# Patient Record
Sex: Female | Born: 1970
Health system: Southern US, Community
[De-identification: ages and names within clinical notes are randomized; demographics above are authoritative.]

## PROBLEM LIST (undated history)

## (undated) DIAGNOSIS — R079 Chest pain, unspecified: Secondary | ICD-10-CM

## (undated) DIAGNOSIS — D649 Anemia, unspecified: Secondary | ICD-10-CM

## (undated) DIAGNOSIS — D219 Benign neoplasm of connective and other soft tissue, unspecified: Secondary | ICD-10-CM

## (undated) HISTORY — PX: THYROID SURGERY: SHX805

## (undated) HISTORY — DX: Chest pain, unspecified: R07.9

---

## 2002-07-17 ENCOUNTER — Other Ambulatory Visit: Admission: RE | Admit: 2002-07-17 | Discharge: 2002-07-17 | Payer: Self-pay | Admitting: Family Medicine

## 2003-05-12 DIAGNOSIS — D219 Benign neoplasm of connective and other soft tissue, unspecified: Secondary | ICD-10-CM

## 2003-05-12 HISTORY — DX: Benign neoplasm of connective and other soft tissue, unspecified: D21.9

## 2004-01-11 ENCOUNTER — Other Ambulatory Visit: Admission: RE | Admit: 2004-01-11 | Discharge: 2004-01-11 | Payer: Self-pay | Admitting: Family Medicine

## 2004-01-16 ENCOUNTER — Encounter: Admission: RE | Admit: 2004-01-16 | Discharge: 2004-01-16 | Payer: Self-pay | Admitting: Family Medicine

## 2004-02-22 ENCOUNTER — Other Ambulatory Visit: Admission: RE | Admit: 2004-02-22 | Discharge: 2004-02-22 | Payer: Self-pay | Admitting: Family Medicine

## 2007-10-05 ENCOUNTER — Ambulatory Visit: Payer: Self-pay | Admitting: Internal Medicine

## 2007-10-05 DIAGNOSIS — D259 Leiomyoma of uterus, unspecified: Secondary | ICD-10-CM | POA: Insufficient documentation

## 2007-10-05 DIAGNOSIS — G43909 Migraine, unspecified, not intractable, without status migrainosus: Secondary | ICD-10-CM | POA: Insufficient documentation

## 2007-10-05 DIAGNOSIS — F17200 Nicotine dependence, unspecified, uncomplicated: Secondary | ICD-10-CM | POA: Insufficient documentation

## 2007-11-25 ENCOUNTER — Encounter: Payer: Self-pay | Admitting: Internal Medicine

## 2011-09-07 DIAGNOSIS — D649 Anemia, unspecified: Secondary | ICD-10-CM | POA: Insufficient documentation

## 2012-10-05 ENCOUNTER — Other Ambulatory Visit: Payer: Self-pay | Admitting: Obstetrics and Gynecology

## 2012-10-05 DIAGNOSIS — D219 Benign neoplasm of connective and other soft tissue, unspecified: Secondary | ICD-10-CM

## 2012-10-11 ENCOUNTER — Ambulatory Visit
Admission: RE | Admit: 2012-10-11 | Discharge: 2012-10-11 | Disposition: A | Discharge status: Home / Self Care | Payer: BC Managed Care – PPO | Source: Ambulatory Visit | Attending: Obstetrics and Gynecology | Admitting: Obstetrics and Gynecology

## 2012-10-11 DIAGNOSIS — D219 Benign neoplasm of connective and other soft tissue, unspecified: Secondary | ICD-10-CM

## 2012-10-11 MED ORDER — GADOBENATE DIMEGLUMINE 529 MG/ML IV SOLN
20.0000 mL | Freq: Once | INTRAVENOUS | Status: AC | PRN
  Administered 2012-10-11: 20 mL via INTRAVENOUS

## 2012-11-24 ENCOUNTER — Other Ambulatory Visit: Payer: Self-pay | Admitting: Obstetrics and Gynecology

## 2012-11-24 DIAGNOSIS — N920 Excessive and frequent menstruation with regular cycle: Secondary | ICD-10-CM

## 2012-11-24 DIAGNOSIS — D259 Leiomyoma of uterus, unspecified: Secondary | ICD-10-CM

## 2012-12-06 ENCOUNTER — Ambulatory Visit
Admission: RE | Admit: 2012-12-06 | Discharge: 2012-12-06 | Disposition: A | Discharge status: Home / Self Care | Payer: BC Managed Care – PPO | Source: Ambulatory Visit | Attending: Obstetrics and Gynecology | Admitting: Obstetrics and Gynecology

## 2012-12-06 DIAGNOSIS — N920 Excessive and frequent menstruation with regular cycle: Secondary | ICD-10-CM

## 2012-12-06 DIAGNOSIS — D259 Leiomyoma of uterus, unspecified: Secondary | ICD-10-CM

## 2012-12-06 HISTORY — DX: Benign neoplasm of connective and other soft tissue, unspecified: D21.9

## 2012-12-19 ENCOUNTER — Encounter (HOSPITAL_COMMUNITY): Payer: Self-pay | Admitting: Pharmacy Technician

## 2012-12-20 ENCOUNTER — Other Ambulatory Visit: Payer: Self-pay | Admitting: Radiology

## 2012-12-23 ENCOUNTER — Encounter (HOSPITAL_COMMUNITY): Payer: Self-pay

## 2012-12-23 ENCOUNTER — Ambulatory Visit (HOSPITAL_COMMUNITY)
Admission: RE | Admit: 2012-12-23 | Discharge: 2012-12-23 | Disposition: A | Discharge status: Home / Self Care | Payer: BC Managed Care – PPO | Source: Ambulatory Visit | Attending: Interventional Radiology | Admitting: Interventional Radiology

## 2012-12-23 ENCOUNTER — Observation Stay (HOSPITAL_COMMUNITY)
Admission: RE | Admit: 2012-12-23 | Discharge: 2012-12-24 | Disposition: A | Discharge status: Home / Self Care | Payer: BC Managed Care – PPO | Source: Ambulatory Visit | Attending: Interventional Radiology | Admitting: Interventional Radiology

## 2012-12-23 ENCOUNTER — Other Ambulatory Visit: Payer: Self-pay | Admitting: Interventional Radiology

## 2012-12-23 VITALS — BP 130/61 | HR 56 | Temp 98.5°F | Resp 18 | Ht 62.0 in | Wt 210.0 lb

## 2012-12-23 DIAGNOSIS — F17200 Nicotine dependence, unspecified, uncomplicated: Secondary | ICD-10-CM | POA: Insufficient documentation

## 2012-12-23 DIAGNOSIS — D259 Leiomyoma of uterus, unspecified: Secondary | ICD-10-CM

## 2012-12-23 DIAGNOSIS — Z79899 Other long term (current) drug therapy: Secondary | ICD-10-CM | POA: Insufficient documentation

## 2012-12-23 DIAGNOSIS — G43909 Migraine, unspecified, not intractable, without status migrainosus: Secondary | ICD-10-CM

## 2012-12-23 DIAGNOSIS — N920 Excessive and frequent menstruation with regular cycle: Secondary | ICD-10-CM

## 2012-12-23 DIAGNOSIS — D251 Intramural leiomyoma of uterus: Principal | ICD-10-CM | POA: Insufficient documentation

## 2012-12-23 HISTORY — DX: Anemia, unspecified: D64.9

## 2012-12-23 LAB — BASIC METABOLIC PANEL
BUN: 9 mg/dL (ref 6–23)
Calcium: 9.5 mg/dL (ref 8.4–10.5)
Chloride: 104 mEq/L (ref 96–112)
GFR calc Af Amer: >90 mL/min (ref >90)
Glucose, Bld: 93 mg/dL (ref 70–99)
Sodium: 137 mEq/L (ref 135–145)

## 2012-12-23 LAB — PROTIME-INR: INR: 0.93 (ref 0.00–1.49)

## 2012-12-23 LAB — CBC
HCT: 37.6 % (ref 36.0–46.0)
MCH: 28.8 pg (ref 26.0–34.0)
MCV: 87.4 fL (ref 78.0–100.0)
WBC: 7.7 10*3/uL (ref 4.0–10.5)

## 2012-12-23 LAB — APTT: aPTT: 30 seconds (ref 24–37)

## 2012-12-23 LAB — HCG, SERUM, QUALITATIVE: Preg, Serum: NEGATIVE

## 2012-12-23 MED ORDER — PROMETHAZINE HCL 25 MG PO TABS: 25.0000 mg | ORAL_TABLET | Freq: Three times a day (TID) | ORAL | Status: DC | PRN

## 2012-12-23 MED ORDER — HYDROMORPHONE HCL PF 2 MG/ML IJ SOLN
INTRAMUSCULAR | Status: AC
  Filled 2012-12-23: qty 1

## 2012-12-23 MED ORDER — MIDAZOLAM HCL 2 MG/2ML IJ SOLN
INTRAMUSCULAR | Status: AC
  Filled 2012-12-23: qty 6

## 2012-12-23 MED ORDER — CEFAZOLIN SODIUM-DEXTROSE 2-3 GM-% IV SOLR
2.0000 g | INTRAVENOUS | Status: AC
  Administered 2012-12-23: 2 g via INTRAVENOUS

## 2012-12-23 MED ORDER — DOCUSATE SODIUM 100 MG PO CAPS
100.0000 mg | ORAL_CAPSULE | Freq: Two times a day (BID) | ORAL | Status: DC
  Administered 2012-12-23 – 2012-12-24 (×2): 100 mg via ORAL
  Filled 2012-12-23 (×3): qty 1

## 2012-12-23 MED ORDER — SODIUM CHLORIDE 0.9 % IJ SOLN: 3.0000 mL | Freq: Two times a day (BID) | INTRAMUSCULAR | Status: DC

## 2012-12-23 MED ORDER — SODIUM CHLORIDE 0.9 % IJ SOLN: 9.0000 mL | INTRAMUSCULAR | Status: DC | PRN

## 2012-12-23 MED ORDER — FENTANYL CITRATE 0.05 MG/ML IJ SOLN
INTRAMUSCULAR | Status: AC
  Filled 2012-12-23: qty 6

## 2012-12-23 MED ORDER — MIDAZOLAM HCL 2 MG/2ML IJ SOLN
INTRAMUSCULAR | Status: AC | PRN
  Administered 2012-12-23: 1 mg via INTRAVENOUS
  Administered 2012-12-23 (×2): 0.5 mg via INTRAVENOUS
  Administered 2012-12-23: 1 mg via INTRAVENOUS
  Administered 2012-12-23 (×2): 0.5 mg via INTRAVENOUS

## 2012-12-23 MED ORDER — CEFAZOLIN SODIUM-DEXTROSE 2-3 GM-% IV SOLR
INTRAVENOUS | Status: AC
  Filled 2012-12-23: qty 50

## 2012-12-23 MED ORDER — FENTANYL CITRATE 0.05 MG/ML IJ SOLN
INTRAMUSCULAR | Status: AC | PRN
  Administered 2012-12-23: 25 ug via INTRAVENOUS
  Administered 2012-12-23: 100 ug via INTRAVENOUS
  Administered 2012-12-23 (×3): 25 ug via INTRAVENOUS

## 2012-12-23 MED ORDER — ONDANSETRON HCL 4 MG/2ML IJ SOLN
4.0000 mg | Freq: Four times a day (QID) | INTRAMUSCULAR | Status: DC | PRN
  Administered 2012-12-23 – 2012-12-24 (×2): 4 mg via INTRAVENOUS

## 2012-12-23 MED ORDER — LIDOCAINE HCL 1 % IJ SOLN
INTRAMUSCULAR | Status: AC
  Filled 2012-12-23: qty 20

## 2012-12-23 MED ORDER — KETOROLAC TROMETHAMINE 30 MG/ML IJ SOLN
30.0000 mg | INTRAMUSCULAR | Status: AC
  Administered 2012-12-23: 30 mg via INTRAVENOUS
  Filled 2012-12-23: qty 1

## 2012-12-23 MED ORDER — HYDROMORPHONE 0.3 MG/ML IV SOLN
INTRAVENOUS | Status: DC
Start: 2012-12-23 — End: 2012-12-24
  Administered 2012-12-23: 15:00:00 via INTRAVENOUS
  Administered 2012-12-23: 1.5 mg via INTRAVENOUS
  Administered 2012-12-24: 0.3 mg via INTRAVENOUS
  Filled 2012-12-23 (×2): qty 25

## 2012-12-23 MED ORDER — ONDANSETRON HCL 4 MG/2ML IJ SOLN
4.0000 mg | Freq: Four times a day (QID) | INTRAMUSCULAR | Status: DC | PRN
  Filled 2012-12-23 (×2): qty 2

## 2012-12-23 MED ORDER — IOHEXOL 300 MG/ML  SOLN
80.0000 mL | Freq: Once | INTRAMUSCULAR | Status: AC | PRN
  Administered 2012-12-23: 60 mL via INTRA_ARTERIAL

## 2012-12-23 MED ORDER — IBUPROFEN 800 MG PO TABS
800.0000 mg | ORAL_TABLET | Freq: Four times a day (QID) | ORAL | Status: DC
  Administered 2012-12-23 – 2012-12-24 (×3): 800 mg via ORAL
  Filled 2012-12-23 (×6): qty 1

## 2012-12-23 MED ORDER — DIPHENHYDRAMINE HCL 12.5 MG/5ML PO ELIX
12.5000 mg | ORAL_SOLUTION | Freq: Four times a day (QID) | ORAL | Status: DC | PRN
  Filled 2012-12-23: qty 5

## 2012-12-23 MED ORDER — SODIUM CHLORIDE 0.9 % IV SOLN
INTRAVENOUS | Status: DC
  Administered 2012-12-23 (×2): via INTRAVENOUS

## 2012-12-23 MED ORDER — NALOXONE HCL 0.4 MG/ML IJ SOLN: 0.4000 mg | INTRAMUSCULAR | Status: DC | PRN

## 2012-12-23 MED ORDER — SODIUM CHLORIDE 0.9 % IV SOLN: 250.0000 mL | INTRAVENOUS | Status: DC | PRN

## 2012-12-23 MED ORDER — DIPHENHYDRAMINE HCL 50 MG/ML IJ SOLN: 12.5000 mg | Freq: Four times a day (QID) | INTRAMUSCULAR | Status: DC | PRN

## 2012-12-23 MED ORDER — SODIUM CHLORIDE 0.9 % IJ SOLN: 3.0000 mL | INTRAMUSCULAR | Status: DC | PRN

## 2012-12-23 MED ORDER — HYDROCODONE-ACETAMINOPHEN 5-325 MG PO TABS: 1.0000 | ORAL_TABLET | ORAL | Status: DC | PRN

## 2012-12-23 MED ORDER — HYDROMORPHONE HCL PF 1 MG/ML IJ SOLN
INTRAMUSCULAR | Status: AC | PRN
  Administered 2012-12-23: 1 mg via INTRAVENOUS

## 2012-12-23 MED ORDER — PROMETHAZINE HCL 25 MG RE SUPP: 25.0000 mg | Freq: Three times a day (TID) | RECTAL | Status: DC | PRN

## 2012-12-23 NOTE — H&P (Signed)
Diana Davies is an 42 y.o. female.   Chief Complaint: menorrhagia, uterine fibroids HPI: Patient with history of symptomatic uterine fibroids presents today for bilateral uterine artery embolization.  Past Medical History  Diagnosis Date  . Fibroids 2005  anemia, migraines, reactive depression ; denies HTN, CAD, DM, cancer, lung disease  PSH: left axillary cyst removal Social History:  reports that she has been smoking Cigarettes.  She started smoking about 30 years ago. She has been smoking about 0.50 packs per day. She does not have any smokeless tobacco history on file. She reports that  drinks alcohol. She reports that she does not use illicit drugs. FH: positive for colon cancer, heart disease, HTN Allergies: No Known Allergies  Current outpatient prescriptions:ascorbic acid (VITAMIN C) 1000 MG tablet, Take 1,000 mg by mouth daily., Disp: , Rfl: ;  ferrous fumarate (HEMOCYTE - 106 MG FE) 325 (106 FE) MG TABS tablet, Take 1 tablet by mouth daily., Disp: , Rfl: ;  Multiple Vitamin (MULTIVITAMIN WITH MINERALS) TABS tablet, Take 1 tablet by mouth daily., Disp: , Rfl: ;  naproxen sodium (ANAPROX) 220 MG tablet, Take 220 mg by mouth 2 (two) times daily with a meal., Disp: , Rfl:  Current facility-administered medications:0.9 %  sodium chloride infusion, , Intravenous, Continuous, D Jeananne Rama, PA-C, Last Rate: 75 mL/hr at 12/23/12 1209;  ceFAZolin (ANCEF) IVPB 2 g/50 mL premix, 2 g, Intravenous, On Call, D Kevin Allred, PA-C;  diphenhydrAMINE (BENADRYL) 12.5 MG/5ML elixir 12.5 mg, 12.5 mg, Oral, Q6H PRN, Dayne Oley Balm III, MD diphenhydrAMINE (BENADRYL) injection 12.5 mg, 12.5 mg, Intravenous, Q6H PRN, Durwin Glaze III, MD;  HYDROmorphone (DILAUDID) PCA injection 0.3 mg/mL, , Intravenous, Q4H, Dayne Oley Balm III, MD;  ketorolac (TORADOL) 30 MG/ML injection 30 mg, 30 mg, Intravenous, On Call, D Jeananne Rama, PA-C;  naloxone (NARCAN) injection 0.4 mg, 0.4 mg, Intravenous, PRN,  Durwin Glaze III, MD ondansetron [facility]) injection 4 mg, 4 mg, Intravenous, Q6H PRN, Durwin Glaze III, MD;  sodium chloride 0.9 % injection 9 mL, 9 mL, Intravenous, PRN, Durwin Glaze III, MD   Results for orders placed during the hospital encounter of 12/23/12 (from the past 48 hour(s))  APTT     Status: None   Collection Time    12/23/12 12:00 PM      Result Value Range   aPTT 30  24 - 37 seconds  CBC     Status: Abnormal   Collection Time    12/23/12 12:00 PM      Result Value Range   WBC 7.7  4.0 - 10.5 K/uL   RBC 4.30  3.87 - 5.11 MIL/uL   Hemoglobin 12.4  12.0 - 15.0 g/dL   HCT 81.1  91.4 - 78.2 %   MCV 87.4  78.0 - 100.0 fL   MCH 28.8  26.0 - 34.0 pg   MCHC 33.0  30.0 - 36.0 g/dL   RDW 95.6  21.3 - 08.6 %   Platelets 474 (*) 150 - 400 K/uL  PROTIME-INR     Status: None   Collection Time    12/23/12 12:00 PM      Result Value Range   Prothrombin Time 12.3  11.6 - 15.2 seconds   INR 0.93  0.00 - 1.49   No results found.  Review of Systems  Constitutional: Negative for fever and chills.  Respiratory: Negative for cough and shortness of breath.   Cardiovascular: Negative for chest pain.  Gastrointestinal: Positive for constipation.  Negative for nausea, vomiting and blood in stool.       Intermittent pelvic pain/cramping  Genitourinary: Negative for dysuria.  Musculoskeletal: Negative for back pain.  Neurological: Negative for headaches.  Endo/Heme/Allergies:       Menorrhagia   Vitals: BP 137/86  HR 101  R 18  TEMP 98  O2 SATS 98% RA Last menstrual period 11/15/2012. Physical Exam  Constitutional: She is oriented to person, place, and time. She appears well-developed and well-nourished.  Cardiovascular: Normal rate and regular rhythm.   Respiratory: Effort normal and breath sounds normal.  GI: Soft. Bowel sounds are normal. There is no tenderness.  obese  Musculoskeletal: Normal range of motion. She exhibits no edema.  Neurological: She  is alert and oriented to person, place, and time.     Assessment/Plan Pt with hx of symptomatic uterine fibroids. Plan is for bilateral uterine artery embolization today followed by overnight observation for pain control/hemodynamic monitoring. Details/risks of procedure d/w pt/sister with their understanding and consent.  ALLRED,D KEVIN 12/23/2012, 12:28 PM

## 2012-12-23 NOTE — Procedures (Signed)
Uterine fibroid embolization via R CFA No complication No blood loss. See complete dictation in [facility].

## 2012-12-23 NOTE — ED Notes (Signed)
5Fr sheath removed from RFA by Dr. Deanne Coffer.  Hemostasis achieved using Exoseal device.  R groin level 0, 2+RDP.  Gauze tegaderm drsg applied.

## 2012-12-23 NOTE — Progress Notes (Signed)
Embolization site CD and I, pedal pulse +2.  

## 2012-12-23 NOTE — Progress Notes (Signed)
EMBOLIZATION SITE CD AND I, PEDAL PULSE +2. 

## 2012-12-23 NOTE — Progress Notes (Signed)
Foley catheter removed per MD order. Patient tolerated procedure well. 

## 2012-12-23 NOTE — Progress Notes (Signed)
Subjective: Pt c/o pelvic pressure; denies N/V  Objective: Vital signs in last 24 hours: Temp:  [97.5 F (36.4 C)-98 F (36.7 C)] 97.5 F (36.4 C) (08/15 1546) Pulse Rate:  [76-101] 76 (08/15 1546) Resp:  [10-18] 11 (08/15 1546) BP: (112-166)/(79-96) 166/83 mmHg (08/15 1546) SpO2:  [96 %-100 %] 100 % (08/15 1546) Weight:  [210 lb (95.255 kg)] 210 lb (95.255 kg) (08/15 1140) Last BM Date: 12/23/12  Intake/Output from previous day:   Intake/Output this shift:  Puncture site rt CFA clean and dry, mildly tender, no hematoma; intact distal pulses; abd- obese,+BS, mildly tender mid pelvic region  Lab Results:   Recent Labs  12/23/12 1200  WBC 7.7  HGB 12.4  HCT 37.6  PLT 474*   BMET  Recent Labs  12/23/12 1200  NA 137  K 3.8  CL 104  CO2 22  GLUCOSE 93  BUN 9  CREATININE 0.59  CALCIUM 9.5   PT/INR  Recent Labs  12/23/12 1200  LABPROT 12.3  INR 0.93   ABG No results found for this basename: PHART, PCO2, PO2, HCO3,  in the last 72 hours  Studies/Results: Ir Angiogram Pelvis Selective Or Supraselective  12/23/2012   *RADIOLOGY REPORT*  Clinical data:  Symptomatic uterine fibroids.  See previous consultation.  BILATERAL UTERINE ARTERY EMBOLIZATION:  Technique: The procedure, risks, benefits, and alternatives were explained to the patient.  Questions regarding the procedure were encouraged and answered.  The patient understands and consents to the procedure.  As antibiotic prophylaxis, cefazolin 2 grams IV was ordered pre- procedure and administered intravenously within one hour of incision.  An appropriate skin entry site was determined under fluoroscopy. Skin site was marked, prepped with Betadine, and draped in usual sterile fashion, and infiltrated locally with 1% lidocaine.  Intravenous Fentanyl and Versed were administered as conscious sedation during continuous cardiorespiratory monitoring by the radiology RN, with a total moderate sedation time of 45  minutes.  Under real-time ultrasound guidance, the right common femoral artery was accessed with a micropuncture 21-gauge needle using singlewall technique in a single pass. Ultrasound imaging documentation was saved. Needle was exchanged over a 018 guide wire for a transitional dilator, which allowed passage of a Benson wire, over which a 5-French vascular sheath was placed, through which a 5 French C2 catheter was used to selectively catheterize the left internal iliac artery for pelvic arteriography. A coaxial Progreat catheter was advanced with the  Renegade wire and used to selectively catheterize the left uterine artery. The microcatheter tip was positioned in the   horizontal segment. Selective arteriogram confirms appropriate positioning. Distal branches of the left uterine artery were embolized with 500-700 micron Embospheres.  Embolization continued until near stasis of flow was achieved. Microcatheter was withdrawn and a followup selective left internal iliac arteriogram was obtained. A Waltman loop was then formed with the C2 catheter, and the right internal iliac artery was selectively catheterized. Again the Progreat catheter with a Renegade guidewire was coaxially advanced and used to selectively catheterize the right uterine artery horizontal segment. Confirmatory arteriogram was performed.  Right uterine artery branches were embolized with 500-700 micron Embospheres to near stasis of flow. A total of 7 vials of Embospheres were utilized for the case. Microcatheter was withdrawn and a followup arteriogram of the right internal iliac artery was performed. C2 catheter was removed.  After confirmatory femoral arteriography, hemostasis was achieved with the aid of the Exoseal device. No immediate complication. Patient tolerated the procedure well.  Fluoroscopy time: 90  minutes 12seconds  IMPRESSION:  1. Technically successful bilateral uterine artery embolization using 500-726micron Embospheres.    Original Report Authenticated By: D. Andria Rhein, MD   Ir Angiogram Pelvis Selective Or Supraselective  12/23/2012   *RADIOLOGY REPORT*  Clinical data:  Symptomatic uterine fibroids.  See previous consultation.  BILATERAL UTERINE ARTERY EMBOLIZATION:  Technique: The procedure, risks, benefits, and alternatives were explained to the patient.  Questions regarding the procedure were encouraged and answered.  The patient understands and consents to the procedure.  As antibiotic prophylaxis, cefazolin 2 grams IV was ordered pre- procedure and administered intravenously within one hour of incision.  An appropriate skin entry site was determined under fluoroscopy. Skin site was marked, prepped with Betadine, and draped in usual sterile fashion, and infiltrated locally with 1% lidocaine.  Intravenous Fentanyl and Versed were administered as conscious sedation during continuous cardiorespiratory monitoring by the radiology RN, with a total moderate sedation time of 45 minutes.  Under real-time ultrasound guidance, the right common femoral artery was accessed with a micropuncture 21-gauge needle using singlewall technique in a single pass. Ultrasound imaging documentation was saved. Needle was exchanged over a 018 guide wire for a transitional dilator, which allowed passage of a Benson wire, over which a 5-French vascular sheath was placed, through which a 5 French C2 catheter was used to selectively catheterize the left internal iliac artery for pelvic arteriography. A coaxial Progreat catheter was advanced with the  Renegade wire and used to selectively catheterize the left uterine artery. The microcatheter tip was positioned in the   horizontal segment. Selective arteriogram confirms appropriate positioning. Distal branches of the left uterine artery were embolized with 500-700 micron Embospheres.  Embolization continued until near stasis of flow was achieved. Microcatheter was withdrawn and a followup selective left  internal iliac arteriogram was obtained. A Waltman loop was then formed with the C2 catheter, and the right internal iliac artery was selectively catheterized. Again the Progreat catheter with a Renegade guidewire was coaxially advanced and used to selectively catheterize the right uterine artery horizontal segment. Confirmatory arteriogram was performed.  Right uterine artery branches were embolized with 500-700 micron Embospheres to near stasis of flow. A total of 7 vials of Embospheres were utilized for the case. Microcatheter was withdrawn and a followup arteriogram of the right internal iliac artery was performed. C2 catheter was removed.  After confirmatory femoral arteriography, hemostasis was achieved with the aid of the Exoseal device. No immediate complication. Patient tolerated the procedure well.  Fluoroscopy time: 16 minutes 12seconds  IMPRESSION:  1. Technically successful bilateral uterine artery embolization using 500-761micron Embospheres.   Original Report Authenticated By: D. Andria Rhein, MD   Ir Angiogram Selective Each Additional Vessel  12/23/2012   *RADIOLOGY REPORT*  Clinical data:  Symptomatic uterine fibroids.  See previous consultation.  BILATERAL UTERINE ARTERY EMBOLIZATION:  Technique: The procedure, risks, benefits, and alternatives were explained to the patient.  Questions regarding the procedure were encouraged and answered.  The patient understands and consents to the procedure.  As antibiotic prophylaxis, cefazolin 2 grams IV was ordered pre- procedure and administered intravenously within one hour of incision.  An appropriate skin entry site was determined under fluoroscopy. Skin site was marked, prepped with Betadine, and draped in usual sterile fashion, and infiltrated locally with 1% lidocaine.  Intravenous Fentanyl and Versed were administered as conscious sedation during continuous cardiorespiratory monitoring by the radiology RN, with a total moderate sedation time of 45  minutes.  Under real-time ultrasound guidance, the  right common femoral artery was accessed with a micropuncture 21-gauge needle using singlewall technique in a single pass. Ultrasound imaging documentation was saved. Needle was exchanged over a 018 guide wire for a transitional dilator, which allowed passage of a Benson wire, over which a 5-French vascular sheath was placed, through which a 5 French C2 catheter was used to selectively catheterize the left internal iliac artery for pelvic arteriography. A coaxial Progreat catheter was advanced with the  Renegade wire and used to selectively catheterize the left uterine artery. The microcatheter tip was positioned in the   horizontal segment. Selective arteriogram confirms appropriate positioning. Distal branches of the left uterine artery were embolized with 500-700 micron Embospheres.  Embolization continued until near stasis of flow was achieved. Microcatheter was withdrawn and a followup selective left internal iliac arteriogram was obtained. A Waltman loop was then formed with the C2 catheter, and the right internal iliac artery was selectively catheterized. Again the Progreat catheter with a Renegade guidewire was coaxially advanced and used to selectively catheterize the right uterine artery horizontal segment. Confirmatory arteriogram was performed.  Right uterine artery branches were embolized with 500-700 micron Embospheres to near stasis of flow. A total of 7 vials of Embospheres were utilized for the case. Microcatheter was withdrawn and a followup arteriogram of the right internal iliac artery was performed. C2 catheter was removed.  After confirmatory femoral arteriography, hemostasis was achieved with the aid of the Exoseal device. No immediate complication. Patient tolerated the procedure well.  Fluoroscopy time: 16 minutes 12seconds  IMPRESSION:  1. Technically successful bilateral uterine artery embolization using 500-77micron Embospheres.    Original Report Authenticated By: D. Andria Rhein, MD   Ir Angiogram Selective Each Additional Vessel  12/23/2012   *RADIOLOGY REPORT*  Clinical data:  Symptomatic uterine fibroids.  See previous consultation.  BILATERAL UTERINE ARTERY EMBOLIZATION:  Technique: The procedure, risks, benefits, and alternatives were explained to the patient.  Questions regarding the procedure were encouraged and answered.  The patient understands and consents to the procedure.  As antibiotic prophylaxis, cefazolin 2 grams IV was ordered pre- procedure and administered intravenously within one hour of incision.  An appropriate skin entry site was determined under fluoroscopy. Skin site was marked, prepped with Betadine, and draped in usual sterile fashion, and infiltrated locally with 1% lidocaine.  Intravenous Fentanyl and Versed were administered as conscious sedation during continuous cardiorespiratory monitoring by the radiology RN, with a total moderate sedation time of 45 minutes.  Under real-time ultrasound guidance, the right common femoral artery was accessed with a micropuncture 21-gauge needle using singlewall technique in a single pass. Ultrasound imaging documentation was saved. Needle was exchanged over a 018 guide wire for a transitional dilator, which allowed passage of a Benson wire, over which a 5-French vascular sheath was placed, through which a 5 French C2 catheter was used to selectively catheterize the left internal iliac artery for pelvic arteriography. A coaxial Progreat catheter was advanced with the  Renegade wire and used to selectively catheterize the left uterine artery. The microcatheter tip was positioned in the   horizontal segment. Selective arteriogram confirms appropriate positioning. Distal branches of the left uterine artery were embolized with 500-700 micron Embospheres.  Embolization continued until near stasis of flow was achieved. Microcatheter was withdrawn and a followup selective left  internal iliac arteriogram was obtained. A Waltman loop was then formed with the C2 catheter, and the right internal iliac artery was selectively catheterized. Again the Progreat catheter with a Renegade guidewire  was coaxially advanced and used to selectively catheterize the right uterine artery horizontal segment. Confirmatory arteriogram was performed.  Right uterine artery branches were embolized with 500-700 micron Embospheres to near stasis of flow. A total of 7 vials of Embospheres were utilized for the case. Microcatheter was withdrawn and a followup arteriogram of the right internal iliac artery was performed. C2 catheter was removed.  After confirmatory femoral arteriography, hemostasis was achieved with the aid of the Exoseal device. No immediate complication. Patient tolerated the procedure well.  Fluoroscopy time: 16 minutes 12seconds  IMPRESSION:  1. Technically successful bilateral uterine artery embolization using 500-711micron Embospheres.   Original Report Authenticated By: D. Andria Rhein, MD   Ir US Guide Vasc Access Right  12/23/2012   *RADIOLOGY REPORT*  Clinical data:  Symptomatic uterine fibroids.  See previous consultation.  BILATERAL UTERINE ARTERY EMBOLIZATION:  Technique: The procedure, risks, benefits, and alternatives were explained to the patient.  Questions regarding the procedure were encouraged and answered.  The patient understands and consents to the procedure.  As antibiotic prophylaxis, cefazolin 2 grams IV was ordered pre- procedure and administered intravenously within one hour of incision.  An appropriate skin entry site was determined under fluoroscopy. Skin site was marked, prepped with Betadine, and draped in usual sterile fashion, and infiltrated locally with 1% lidocaine.  Intravenous Fentanyl and Versed were administered as conscious sedation during continuous cardiorespiratory monitoring by the radiology RN, with a total moderate sedation time of 45 minutes.  Under  real-time ultrasound guidance, the right common femoral artery was accessed with a micropuncture 21-gauge needle using singlewall technique in a single pass. Ultrasound imaging documentation was saved. Needle was exchanged over a 018 guide wire for a transitional dilator, which allowed passage of a Benson wire, over which a 5-French vascular sheath was placed, through which a 5 French C2 catheter was used to selectively catheterize the left internal iliac artery for pelvic arteriography. A coaxial Progreat catheter was advanced with the  Renegade wire and used to selectively catheterize the left uterine artery. The microcatheter tip was positioned in the   horizontal segment. Selective arteriogram confirms appropriate positioning. Distal branches of the left uterine artery were embolized with 500-700 micron Embospheres.  Embolization continued until near stasis of flow was achieved. Microcatheter was withdrawn and a followup selective left internal iliac arteriogram was obtained. A Waltman loop was then formed with the C2 catheter, and the right internal iliac artery was selectively catheterized. Again the Progreat catheter with a Renegade guidewire was coaxially advanced and used to selectively catheterize the right uterine artery horizontal segment. Confirmatory arteriogram was performed.  Right uterine artery branches were embolized with 500-700 micron Embospheres to near stasis of flow. A total of 7 vials of Embospheres were utilized for the case. Microcatheter was withdrawn and a followup arteriogram of the right internal iliac artery was performed. C2 catheter was removed.  After confirmatory femoral arteriography, hemostasis was achieved with the aid of the Exoseal device. No immediate complication. Patient tolerated the procedure well.  Fluoroscopy time: 16 minutes 12seconds  IMPRESSION:  1. Technically successful bilateral uterine artery embolization using 500-754micron Embospheres.   Original Report  Authenticated By: D. Andria Rhein, MD   Ir Embo Tumor Organ Ischemia Infarct Inc Guide Roadmapping  12/23/2012   *RADIOLOGY REPORT*  Clinical data:  Symptomatic uterine fibroids.  See previous consultation.  BILATERAL UTERINE ARTERY EMBOLIZATION:  Technique: The procedure, risks, benefits, and alternatives were explained to the patient.  Questions regarding the procedure were  encouraged and answered.  The patient understands and consents to the procedure.  As antibiotic prophylaxis, cefazolin 2 grams IV was ordered pre- procedure and administered intravenously within one hour of incision.  An appropriate skin entry site was determined under fluoroscopy. Skin site was marked, prepped with Betadine, and draped in usual sterile fashion, and infiltrated locally with 1% lidocaine.  Intravenous Fentanyl and Versed were administered as conscious sedation during continuous cardiorespiratory monitoring by the radiology RN, with a total moderate sedation time of 45 minutes.  Under real-time ultrasound guidance, the right common femoral artery was accessed with a micropuncture 21-gauge needle using singlewall technique in a single pass. Ultrasound imaging documentation was saved. Needle was exchanged over a 018 guide wire for a transitional dilator, which allowed passage of a Benson wire, over which a 5-French vascular sheath was placed, through which a 5 French C2 catheter was used to selectively catheterize the left internal iliac artery for pelvic arteriography. A coaxial Progreat catheter was advanced with the  Renegade wire and used to selectively catheterize the left uterine artery. The microcatheter tip was positioned in the   horizontal segment. Selective arteriogram confirms appropriate positioning. Distal branches of the left uterine artery were embolized with 500-700 micron Embospheres.  Embolization continued until near stasis of flow was achieved. Microcatheter was withdrawn and a followup selective left  internal iliac arteriogram was obtained. A Waltman loop was then formed with the C2 catheter, and the right internal iliac artery was selectively catheterized. Again the Progreat catheter with a Renegade guidewire was coaxially advanced and used to selectively catheterize the right uterine artery horizontal segment. Confirmatory arteriogram was performed.  Right uterine artery branches were embolized with 500-700 micron Embospheres to near stasis of flow. A total of 7 vials of Embospheres were utilized for the case. Microcatheter was withdrawn and a followup arteriogram of the right internal iliac artery was performed. C2 catheter was removed.  After confirmatory femoral arteriography, hemostasis was achieved with the aid of the Exoseal device. No immediate complication. Patient tolerated the procedure well.  Fluoroscopy time: 16 minutes 12seconds  IMPRESSION:  1. Technically successful bilateral uterine artery embolization using 500-761micron Embospheres.   Original Report Authenticated By: D. Andria Rhein, MD    Anti-infectives: Anti-infectives   Start     Dose/Rate Route Frequency Ordered Stop   12/23/12 1145  ceFAZolin (ANCEF) IVPB 2 g/50 mL premix     2 g 100 mL/hr over 30 Minutes Intravenous On call 12/23/12 1133 12/23/12 1440      Assessment/Plan: s/p bilateral uterine artery embolization 8/15 secondary to symptomatic uterine fibroids. For overnight obs. Dilaudid PCA for pain. F/u with Dr. Deanne Coffer in IR clinic in 2-3 weeks.   LOS: 0 days    ALLRED,D [facility] 12/23/2012

## 2012-12-23 NOTE — Progress Notes (Signed)
Embolization site CD and I, pedal pulse +2.

## 2012-12-23 NOTE — Progress Notes (Signed)
EMBOLIZATION SITE CD AND I, PEDAL PULSE +2.

## 2012-12-24 NOTE — Discharge Summary (Signed)
Physician Discharge Summary  Patient ID: Diana Davies MRN: [id number] DOB/AGE: 05/15/1970 42 y.o.  Admit date: 12/23/2012 Discharge date: 12/24/2012  Admission Diagnoses: Painful Uterine Fibroids- dysmenorrhea and menorrhagia  Discharge Diagnoses: treatment of uterine fibroid symptoms Active Problems:   * No active hospital problems. *   Discharged Condition: improved  Hospital Course: Bilateral uterine fibroid embolization was performed in Interventional Radiology with Dr Oley Balm 12/23/2012. Pt tolerated procedure well. Overnight stay was without event. Slept well, eating well. Did have some nausea this am but now resolved. No vomiting.  Complains of some low abdominal cramping which is expected. Pt has urinated and passing gas. I have seen and examined pt.  UOP is good; afeb; VSS Plan for follow up with Dr Deanne Coffer in 2-4 weeks; pt will hear from clinic for appt.  Consults: None  Significant Diagnostic Studies: Bilateral Uterine Artery Arteriogram  Treatments: B Uterine Artery Embolization  Discharge Exam: Blood pressure 130/61, pulse 56, temperature 98.5 F (36.9 C), temperature source Oral, resp. rate 18, height 5\' 2"  (1.575 m), weight 210 lb (95.255 kg), last menstrual period 12/12/2012, SpO2 100.00%.  PE: VSS; afeb  Heart: RRR Lungs: CTA Abd: soft; +BS; NT Low abd - minimal pain Extr: FROM Rt groin: NT; no bleeding; no hematoma Rt foot 2+ pulses  Results for orders placed during the hospital encounter of 12/23/12  APTT      Result Value Range   aPTT 30  24 - 37 seconds  BASIC METABOLIC PANEL      Result Value Range   Sodium 137  135 - 145 mEq/L   Potassium 3.8  3.5 - 5.1 mEq/L   Chloride 104  96 - 112 mEq/L   CO2 22  19 - 32 mEq/L   Glucose, Bld 93  70 - 99 mg/dL   BUN 9  6 - 23 mg/dL   Creatinine, Ser 8.29  0.50 - 1.10 mg/dL   Calcium 9.5  8.4 - 56.2 mg/dL   GFR calc non Af Amer >90  >90 mL/min   GFR calc Af Amer >90  >90 mL/min  CBC      Result  Value Range   WBC 7.7  4.0 - 10.5 K/uL   RBC 4.30  3.87 - 5.11 MIL/uL   Hemoglobin 12.4  12.0 - 15.0 g/dL   HCT 13.0  86.5 - 78.4 %   MCV 87.4  78.0 - 100.0 fL   MCH 28.8  26.0 - 34.0 pg   MCHC 33.0  30.0 - 36.0 g/dL   RDW 69.6  29.5 - 28.4 %   Platelets 474 (*) 150 - 400 K/uL  HCG, SERUM, QUALITATIVE      Result Value Range   Preg, Serum NEGATIVE  NEGATIVE  PROTIME-INR      Result Value Range   Prothrombin Time 12.3  11.6 - 15.2 seconds   INR 0.93  0.00 - 1.49    Disposition: B Uterine Artery Embolization performed in IR 12/23/12 Pt has done well overnight Plan for dc now To follow up with Dr Deanne Coffer in 2-4 weeks; clinic will call pt. Continue all home meds Rx: Ibu 600 mg #30        Vicodin 5/325 mg #20        Colace 100 mg #10        Phenergan 12.5 mg #10 Pt has good understanding of dc instructions Agreeable to plan   Discharge Orders   Future Orders Complete By Expires   IR Radiologist Eval &  Mgmt  01/24/2013 02/23/2014   Questions:     Is the patient pregnant?:  No   Preferred Imaging Location?:  [facility]   Reason for Exam (SYMPTOM  OR DIAGNOSIS REQUIRED):  [place] 12/23/12; 2-4 week f/u   Call MD for:  persistant nausea and vomiting  As directed    Call MD for:  redness, tenderness, or signs of infection (pain, swelling, redness, odor or green/yellow discharge around incision site)  As directed    Call MD for:  severe uncontrolled pain  As directed    Call MD for:  temperature >100.4  As directed    Diet - low sodium heart healthy  As directed    Discharge instructions  As directed    Comments:     Restful x 3 days; follow up with Dr Deanne Coffer 2-4 weeks; scheduled will call pt with appt date and time   Discharge wound care:  As directed    Comments:     Leave bandage til after shower today; replace bandage with band aid today and daily x 5 days   Driving Restrictions  As directed    Comments:     No driving x 5 days   Increase activity slowly  As  directed    Lifting restrictions  As directed    Comments:     No lifting over 10 lbs x 5 days   Other Restrictions  As directed    Comments:     May return to work in 2 weeks       Medication List         ascorbic acid 1000 MG tablet  Commonly known as:  VITAMIN C  Take 1,000 mg by mouth daily.     ferrous fumarate 325 (106 FE) MG Tabs tablet  Commonly known as:  HEMOCYTE - 106 mg FE  Take 1 tablet by mouth daily.     multivitamin with minerals Tabs tablet  Take 1 tablet by mouth daily.     naproxen sodium 220 MG tablet  Commonly known as:  ANAPROX  Take 220 mg by mouth 2 (two) times daily with a meal.         Signed: Ganesh Deeg A 12/24/2012, 10:39 AM

## 2012-12-24 NOTE — Progress Notes (Signed)
Discharged instructions reviewed with patient and her sister, they both verbalized understanding,prescriptions given to them- Hulda Marin RN

## 2012-12-24 NOTE — Progress Notes (Signed)
Patient tolerated her diet,no n/v, stable.Hulda Marin RN

## 2012-12-24 NOTE — Progress Notes (Signed)
Patient d/c'd, stable.Hulda Marin RN

## 2012-12-28 ENCOUNTER — Telehealth: Payer: Self-pay | Admitting: Radiology

## 2012-12-28 NOTE — Telephone Encounter (Signed)
Patient called with questions regarding medications. Stopped oxycodone 12/27/2012.  Instructed to take entire Rx strength Ibuprofen as directed.  Stool softener as needed.  Patient states that she had a normal BM this am & that she is feeling better today.    Martrice Apt Carmell Austria, RN 12/28/2012 3:06 PM

## 2013-01-11 ENCOUNTER — Inpatient Hospital Stay: Admission: RE | Admit: 2013-01-11 | Payer: BC Managed Care – PPO | Source: Ambulatory Visit

## 2013-01-26 ENCOUNTER — Encounter: Payer: Self-pay | Admitting: Emergency Medicine

## 2013-01-26 ENCOUNTER — Ambulatory Visit
Admission: RE | Admit: 2013-01-26 | Discharge: 2013-01-26 | Disposition: A | Discharge status: Home / Self Care | Payer: BC Managed Care – PPO | Source: Ambulatory Visit | Attending: Radiology | Admitting: Radiology

## 2013-01-26 DIAGNOSIS — F17200 Nicotine dependence, unspecified, uncomplicated: Secondary | ICD-10-CM

## 2013-01-26 DIAGNOSIS — D259 Leiomyoma of uterus, unspecified: Secondary | ICD-10-CM

## 2013-01-26 DIAGNOSIS — G43909 Migraine, unspecified, not intractable, without status migrainosus: Secondary | ICD-10-CM

## 2013-01-26 DIAGNOSIS — N920 Excessive and frequent menstruation with regular cycle: Secondary | ICD-10-CM

## 2013-01-26 NOTE — Progress Notes (Signed)
LMP:  01/21/13.  Length:  5-6 days. Decreased flow.  Moderate cramping.  Occasional spotting.   Returned to work 01/05/2013.  Overall doing well.  Jaysa Kise Carmell Austria, RN 01/26/2013 3:15 PM

## 2013-03-29 ENCOUNTER — Telehealth: Payer: Self-pay | Admitting: Radiology

## 2013-03-29 NOTE — Telephone Encounter (Signed)
Left message to request patient return call for 3 mo follow up [place] status update.  Morenike Cuff Carmell Austria, RN 03/29/2013 12:31 PM

## 2013-03-30 ENCOUNTER — Telehealth: Payer: Self-pay | Admitting: Emergency Medicine

## 2013-03-30 NOTE — Telephone Encounter (Signed)
Pt returned our call for 68mo f/u [place].  She is doing great, each month gets better and better. She is "doing fabulous" !!!  We will call her for her 36mo f/u appt.    She had questions about billing and I referred her to [facility] at [phone]  Jari Sportsman, EMT 03/30/2013 12:22 PM

## 2013-05-30 ENCOUNTER — Other Ambulatory Visit (HOSPITAL_COMMUNITY): Payer: Self-pay | Admitting: Interventional Radiology

## 2013-05-30 DIAGNOSIS — D219 Benign neoplasm of connective and other soft tissue, unspecified: Secondary | ICD-10-CM

## 2013-06-08 ENCOUNTER — Other Ambulatory Visit: Payer: Self-pay | Admitting: Obstetrics and Gynecology

## 2013-06-08 DIAGNOSIS — D219 Benign neoplasm of connective and other soft tissue, unspecified: Secondary | ICD-10-CM

## 2013-07-20 ENCOUNTER — Telehealth: Payer: Self-pay | Admitting: Radiology

## 2013-07-20 NOTE — Telephone Encounter (Signed)
Left message on patient's M# ---need to schedule 8 mo follow up [place].  Lillyen Schow Riki Rusk, RN 07/20/2013 2:05 PM

## 2013-10-17 ENCOUNTER — Telehealth: Payer: Self-pay | Admitting: Radiology

## 2013-10-17 NOTE — Telephone Encounter (Signed)
Left msg on M# requesting patient call for status update & to schedule follow up (8 mos follow up [place])  Reece Levy, RN 10/17/2013 10:39 AM

## 2013-10-24 ENCOUNTER — Encounter: Payer: Self-pay | Admitting: Radiology

## 2015-11-21 ENCOUNTER — Encounter (HOSPITAL_COMMUNITY): Payer: Self-pay | Admitting: *Deleted

## 2015-11-21 ENCOUNTER — Emergency Department (HOSPITAL_COMMUNITY): Payer: BLUE CROSS/BLUE SHIELD

## 2015-11-21 ENCOUNTER — Emergency Department (HOSPITAL_COMMUNITY)
Admission: EM | Admit: 2015-11-21 | Discharge: 2015-11-22 | Disposition: A | Discharge status: Home / Self Care | Payer: BLUE CROSS/BLUE SHIELD | Attending: Emergency Medicine | Admitting: Emergency Medicine

## 2015-11-21 DIAGNOSIS — F17210 Nicotine dependence, cigarettes, uncomplicated: Secondary | ICD-10-CM | POA: Diagnosis not present

## 2015-11-21 DIAGNOSIS — Z79899 Other long term (current) drug therapy: Secondary | ICD-10-CM | POA: Insufficient documentation

## 2015-11-21 DIAGNOSIS — R079 Chest pain, unspecified: Secondary | ICD-10-CM | POA: Insufficient documentation

## 2015-11-21 DIAGNOSIS — R0789 Other chest pain: Secondary | ICD-10-CM | POA: Diagnosis not present

## 2015-11-21 LAB — CBC
HEMATOCRIT: 37.6 % (ref 36.0–46.0)
HEMOGLOBIN: 12 g/dL (ref 12.0–15.0)
MCH: 27.5 pg (ref 26.0–34.0)
MCHC: 31.9 g/dL (ref 30.0–36.0)
MCV: 86 fL (ref 78.0–100.0)
Platelets: 400 10*3/uL (ref 150–400)
RBC: 4.37 MIL/uL (ref 3.87–5.11)
RDW: 15.7 % — AB (ref 11.5–15.5)
WBC: 6.9 10*3/uL (ref 4.0–10.5)

## 2015-11-21 LAB — BASIC METABOLIC PANEL
Anion gap: 6 (ref 5–15)
BUN: 7 mg/dL (ref 6–20)
CO2: 22 mmol/L (ref 22–32)
Calcium: 8.9 mg/dL (ref 8.9–10.3)
Chloride: 108 mmol/L (ref 101–111)
Creatinine, Ser: 0.66 mg/dL (ref 0.44–1.00)
GFR calc Af Amer: >60 mL/min (ref >60)
GFR calc non Af Amer: >60 mL/min (ref >60)
GLUCOSE: 117 mg/dL — AB (ref 65–99)
Potassium: 3.6 mmol/L (ref 3.5–5.1)
Sodium: 136 mmol/L (ref 135–145)

## 2015-11-21 LAB — I-STAT TROPONIN, ED: Troponin i, poc: 0 ng/mL (ref 0.00–0.08)

## 2015-11-21 NOTE — ED Provider Notes (Signed)
CSN: [id number]     Arrival date & time 11/21/15  2024 History  By signing my name below, I, Evelene Croon, attest that this documentation has been prepared under the direction and in the presence of Orpah Greek, MD . Electronically Signed: Evelene Croon, Scribe. 11/21/2015. 11:42 PM.    Chief Complaint  Patient presents with  . Chest Pain   The history is provided by the patient. No language interpreter was used.   HPI Comments:  Diana Davies is a 45 y.o. female who presents to the Emergency Department complaining of central CP that radiates into her left arm and back. She notes her symptom has been intermittent x a few days. Pt states she may have had similar pain in the past but has never been evaluated for it. She denies exertional trigger. No alleviating factors noted. She also notes FHx of MI; states her mother died of an MI at age 4. Pt is a smoker. She denies h/o DM, HTN, and HLD.   Past Medical History  Diagnosis Date  . Fibroids 2005  . Anemia    History reviewed. No pertinent past surgical history. No family history on file. Social History  Substance Use Topics  . Smoking status: Current Every Day Smoker -- 0.50 packs/day    Types: Cigarettes    Start date: 12/07/1982  . Smokeless tobacco: None  . Alcohol Use: Yes     Comment: 4 x's/wk   OB History    No data available     Review of Systems  Cardiovascular: Positive for chest pain.  Musculoskeletal: Positive for myalgias (LUE) and back pain.  All other systems reviewed and are negative.   Allergies  Review of patient's allergies indicates no known allergies.  Home Medications   Prior to Admission medications   Medication Sig Start Date End Date Taking? Authorizing Provider  ascorbic acid (VITAMIN C) 1000 MG tablet Take 1,000 mg by mouth daily.   Yes Historical Provider, MD  Multiple Vitamin (MULTIVITAMIN WITH MINERALS) TABS tablet Take 1 tablet by mouth daily.   Yes Historical Provider, MD   BP  153/94 mmHg  Pulse 98  Temp(Src) 97.6 F (36.4 C) (Oral)  Resp 18  SpO2 100%  LMP 10/31/2015 Physical Exam  Constitutional: She is oriented to person, place, and time. She appears well-developed and well-nourished. No distress.  HENT:  Head: Normocephalic and atraumatic.  Right Ear: Hearing normal.  Left Ear: Hearing normal.  Nose: Nose normal.  Mouth/Throat: Oropharynx is clear and moist and mucous membranes are normal.  Eyes: Conjunctivae and EOM are normal. Pupils are equal, round, and reactive to light.  Neck: Normal range of motion. Neck supple.  Cardiovascular: Normal rate, regular rhythm, S1 normal and S2 normal.  Exam reveals no gallop and no friction rub.   No murmur heard. Pulmonary/Chest: Effort normal and breath sounds normal. No respiratory distress. She exhibits no tenderness.  Abdominal: Soft. Normal appearance and bowel sounds are normal. There is no hepatosplenomegaly. There is no tenderness. There is no rebound, no guarding, no tenderness at McBurney's point and negative Murphy's sign. No hernia.  Musculoskeletal: Normal range of motion.  Neurological: She is alert and oriented to person, place, and time. She has normal strength. No cranial nerve deficit or sensory deficit. Coordination normal. GCS eye subscore is 4. GCS verbal subscore is 5. GCS motor subscore is 6.  Skin: Skin is warm, dry and intact. No rash noted. No cyanosis.  Psychiatric: She has a normal mood and  affect. Her speech is normal and behavior is normal. Thought content normal.  Nursing note and vitals reviewed.   ED Course  Procedures   DIAGNOSTIC STUDIES:  Oxygen Saturation is 100% on RA, normal by my interpretation.    COORDINATION OF CARE:  11:36 PM Pt updated with results. Discussed treatment plan with pt at bedside and pt agreed to plan.  Labs Review Labs Reviewed  BASIC METABOLIC PANEL - Abnormal; Notable for the following:    Glucose, Bld 117 (*)    All other components within  normal limits  CBC - Abnormal; Notable for the following:    RDW 15.7 (*)    All other components within normal limits  I-STAT TROPOININ, ED  Randolm Idol, ED    Imaging Review Dg Chest 2 View  11/21/2015  CLINICAL DATA:  Two day history of left-sided chest pain radiating into the back and left arm. Current 30 year smoker. EXAM: CHEST  2 VIEW COMPARISON:  None. FINDINGS: Cardiomediastinal silhouette unremarkable. Lungs clear. Bronchovascular markings normal. Pulmonary vascularity normal. No visible pleural effusions. No pneumothorax. Mild degenerative changes involving the thoracic spine. IMPRESSION: No acute cardiopulmonary disease. Electronically Signed   By: Evangeline Dakin M.D.   On: 11/21/2015 21:12   I have personally reviewed and evaluated these images and lab results as part of my medical decision-making.   EKG Interpretation   Date/Time:  Thursday November 21 2015 20:33:33 EDT Ventricular Rate:  89 PR Interval:  136 QRS Duration: 88 QT Interval:  374 QTC Calculation: 455 R Axis:   23 Text Interpretation:  Normal sinus rhythm Normal ECG Confirmed by POLLINA   MD, CHRISTOPHER [phone]) on 11/21/2015 11:14:11 PM      MDM   Final diagnoses:  Chest pain, unspecified chest pain type   Patient presents to the ER for evaluation of chest discomfort. She reports the symptoms have been intermittent for several days. She has not identified any causal or exacerbating factors. Patient is not experiencing discomfort at arrival to the ER. She did have discomfort earlier today, however. Patient does report associated anxiety. She is stressed about her work. Patient is a smoker and has significant cardiac history, specifically mother had MI at 45 years old. Based on this significant family history, I did recommend hospitalization for complete cardiac workup. Patient was very hesitant to be admitted. She reports that she needs to be at work tomorrow and the stress that is causing her anxiety  would be worse if she missed work. I did tell her that she could get a work note and we can take care of her job problems but she did not wish to be admitted. She therefore had a repeat troponin which was negative. She does not have any positive findings here in the ER and has not had any chest pain while she is here in the ER. She was hypertensive at arrival, but this was secondary to her anxiety. This has self corrected without intervention. Patient was counseled that in absence of admission, she needs prompt follow-up with cardiology for stress test and needs to return to the ER for symptoms worsen. She understands and agrees with the plan.  I personally performed the services described in this documentation, which was scribed in my presence. The recorded information has been reviewed and is accurate.    Orpah Greek, MD 11/22/15 [phone]

## 2015-11-21 NOTE — ED Notes (Signed)
Pt c/o central chest pressure with radiation to left arm starting today.

## 2015-11-22 LAB — I-STAT TROPONIN, ED: TROPONIN I, POC: 0 ng/mL (ref 0.00–0.08)

## 2015-11-22 NOTE — Discharge Instructions (Signed)
Nonspecific Chest Pain  °Chest pain can be caused by many different conditions. There is always a chance that your pain could be related to something serious, such as a heart attack or a blood clot in your lungs. Chest pain can also be caused by conditions that are not life-threatening. If you have chest pain, it is very important to follow up with your health care provider. °CAUSES  °Chest pain can be caused by: °· Heartburn. °· Pneumonia or bronchitis. °· Anxiety or stress. °· Inflammation around your heart (pericarditis) or lung (pleuritis or pleurisy). °· A blood clot in your lung. °· A collapsed lung (pneumothorax). It can develop suddenly on its own (spontaneous pneumothorax) or from trauma to the chest. °· Shingles infection (varicella-zoster virus). °· Heart attack. °· Damage to the bones, muscles, and cartilage that make up your chest wall. This can include: °¨ Bruised bones due to injury. °¨ Strained muscles or cartilage due to frequent or repeated coughing or overwork. °¨ Fracture to one or more ribs. °¨ Sore cartilage due to inflammation (costochondritis). °RISK FACTORS  °Risk factors for chest pain may include: °· Activities that increase your risk for trauma or injury to your chest. °· Respiratory infections or conditions that cause frequent coughing. °· Medical conditions or overeating that can cause heartburn. °· Heart disease or family history of heart disease. °· Conditions or health behaviors that increase your risk of developing a blood clot. °· Having had chicken pox (varicella zoster). °SIGNS AND SYMPTOMS °Chest pain can feel like: °· Burning or tingling on the surface of your chest or deep in your chest. °· Crushing, pressure, aching, or squeezing pain. °· Dull or sharp pain that is worse when you move, cough, or take a deep breath. °· Pain that is also felt in your back, neck, shoulder, or arm, or pain that spreads to any of these areas. °Your chest pain may come and go, or it may stay  constant. °DIAGNOSIS °Lab tests or other studies may be needed to find the cause of your pain. Your health care provider may have you take a test called an ambulatory ECG (electrocardiogram). An ECG records your heartbeat patterns at the time the test is performed. You may also have other tests, such as: °· Transthoracic echocardiogram (TTE). During echocardiography, sound waves are used to create a picture of all of the heart structures and to look at how blood flows through your heart. °· Transesophageal echocardiogram (TEE). This is a more advanced imaging test that obtains images from inside your body. It allows your health care provider to see your heart in finer detail. °· Cardiac monitoring. This allows your health care provider to monitor your heart rate and rhythm in real time. °· Holter monitor. This is a portable device that records your heartbeat and can help to diagnose abnormal heartbeats. It allows your health care provider to track your heart activity for several days, if needed. °· Stress tests. These can be done through exercise or by taking medicine that makes your heart beat more quickly. °· Blood tests. °· Imaging tests. °TREATMENT  °Your treatment depends on what is causing your chest pain. Treatment may include: °· Medicines. These may include: °¨ Acid blockers for heartburn. °¨ Anti-inflammatory medicine. °¨ Pain medicine for inflammatory conditions. °¨ Antibiotic medicine, if an infection is present. °¨ Medicines to dissolve blood clots. °¨ Medicines to treat coronary artery disease. °· Supportive care for conditions that do not require medicines. This may include: °¨ Resting. °¨ Applying heat   or cold packs to injured areas. °¨ Limiting activities until pain decreases. °HOME CARE INSTRUCTIONS °· If you were prescribed an antibiotic medicine, finish it all even if you start to feel better. °· Avoid any activities that bring on chest pain. °· Do not use any tobacco products, including  cigarettes, chewing tobacco, or electronic cigarettes. If you need help quitting, ask your health care provider. °· Do not drink alcohol. °· Take medicines only as directed by your health care provider. °· Keep all follow-up visits as directed by your health care provider. This is important. This includes any further testing if your chest pain does not go away. °· If heartburn is the cause for your chest pain, you may be told to keep your head raised (elevated) while sleeping. This reduces the chance that acid will go from your stomach into your esophagus. °· Make lifestyle changes as directed by your health care provider. These may include: °¨ Getting regular exercise. Ask your health care provider to suggest some activities that are safe for you. °¨ Eating a heart-healthy diet. A registered dietitian can help you to learn healthy eating options. °¨ Maintaining a healthy weight. °¨ Managing diabetes, if necessary. °¨ Reducing stress. °SEEK MEDICAL CARE IF: °· Your chest pain does not go away after treatment. °· You have a rash with blisters on your chest. °· You have a fever. °SEEK IMMEDIATE MEDICAL CARE IF:  °· Your chest pain is worse. °· You have an increasing cough, or you cough up blood. °· You have severe abdominal pain. °· You have severe weakness. °· You faint. °· You have chills. °· You have sudden, unexplained chest discomfort. °· You have sudden, unexplained discomfort in your arms, back, neck, or jaw. °· You have shortness of breath at any time. °· You suddenly start to sweat, or your skin gets clammy. °· You feel nauseous or you vomit. °· You suddenly feel light-headed or dizzy. °· Your heart begins to beat quickly, or it feels like it is skipping beats. °These symptoms may represent a serious problem that is an emergency. Do not wait to see if the symptoms will go away. Get medical help right away. Call your local emergency services (911 in the [place] Do not drive yourself to the hospital. °  °This  information is not intended to replace advice given to you by your health care provider. Make sure you discuss any questions you have with your health care provider. °  °Document Released: 02/04/2005 Document Revised: 05/18/2014 Document Reviewed: 12/01/2013 °Elsevier Interactive Patient Education ©2016 [employer] ° °

## 2015-12-03 ENCOUNTER — Ambulatory Visit: Payer: BLUE CROSS/BLUE SHIELD | Admitting: Cardiovascular Disease

## 2015-12-16 NOTE — Progress Notes (Signed)
Cardiology Office Note   Date:  12/17/2015   ID:  Diana Davies, DOB Sep 12, 1970, MRN [id number]  PCP:  Elyn Peers, MD  Cardiologist:   Jenkins Rouge, MD   No chief complaint on file.     History of Present Illness: Diana Davies is a 45 y.o. female who presents for evaluation of chest pain. Seen in ER 11/21/15. Has some stress at work. EF evaluation negative. Troponin negative x 2 ECG no acute changes  CXR NAD Smoker with family history of CAD mother with MI at age 43  Works at [employer] last 10 years. Does go to gym with no issues. Pain was central lasted hours. Radiated to right arm. Has not recurred. Mom had MI at home got taken to hospital in [place] but never came home. Sister in [place] weighs over 300 lbs and has HTN>    Patient is a vegetarian     Past Medical History:  Diagnosis Date  . Anemia   . Chest pain   . Fibroids 2005    Past Surgical History:  Procedure Laterality Date  . THYROID SURGERY       Current Outpatient Prescriptions  Medication Sig Dispense Refill  . ascorbic acid (VITAMIN C) 1000 MG tablet Take 1,000 mg by mouth daily.    Marland Kitchen aspirin 81 MG tablet Take 81 mg by mouth daily as needed (per patient).    . COD LIVER OIL PO Take 1 capsule by mouth daily as needed (per patient).    . Cyanocobalamin (VITAMIN B-12 PO) Take 1 tablet by mouth daily as needed (per patient).    . IRON PO Take 1 capsule by mouth daily as needed (per patient).    Marland Kitchen MAGNESIUM PO Take 1 tablet by mouth daily as needed (per patient).    . Multiple Vitamin (MULTIVITAMIN WITH MINERALS) TABS tablet Take 1 tablet by mouth daily.    . Multiple Vitamins-Minerals (ECHINACEA ACZ PO) Take 1 tablet by mouth daily as needed (per patient).    . TURMERIC PO Take 1 capsule by mouth daily as needed (per patient).     No current facility-administered medications for this visit.     Allergies:   Review of patient's allergies indicates no known allergies.    Social History:  The patient   reports that she has been smoking Cigarettes.  She started smoking about 33 years ago. She has been smoking about 0.50 packs per day. She does not have any smokeless tobacco history on file. She reports that she drinks alcohol. She reports that she does not use drugs.   Family History:  The patient's family history includes Cancer in her father; Heart attack (age of onset: 38) in her mother; Hypertension in her sister.    ROS:  Please see the history of present illness.   Otherwise, review of systems are positive for none.   All other systems are reviewed and negative.    PHYSICAL EXAM: VS:  BP (!) 147/82   Pulse 68   Ht 5\' 2"  (1.575 m)   Wt 209 lb 1.9 oz (94.9 kg)   LMP 11/30/2015   BMI 38.25 kg/m  , BMI Body mass index is 38.25 kg/m. Affect appropriate Overweight black female  HEENT: normal Neck supple with no adenopathy JVP normal no bruits no thyromegaly Lungs clear with no wheezing and good diaphragmatic motion Heart:  S1/S2 no murmur, no rub, gallop or click PMI normal Abdomen: benighn, BS positve, no tenderness, no AAA no bruit.  No HSM or HJR Distal pulses intact with no bruits No edema Neuro non-focal Skin warm and dry No muscular weakness    EKG:   SR rate 89 poor R wave progression nonspecific ST changes    Recent Labs: 11/21/2015: BUN 7; Creatinine, Ser 0.66; Hemoglobin 12.0; Platelets 400; Potassium 3.6; Sodium 136    Lipid Panel No results found for: CHOL, TRIG, HDL, CHOLHDL, VLDL, LDLCALC, LDLDIRECT    Wt Readings from Last 3 Encounters:  12/17/15 209 lb 1.9 oz (94.9 kg)  12/23/12 210 lb (95.3 kg)  12/23/12 210 lb (95.3 kg)      Other studies Reviewed: Additional studies/ records that were reviewed today include:  ER notes, labs CXR and ECG;s .    ASSESSMENT AND PLAN:  1.  Chest Pain:  Atypical r/o in ER Smoker with family history and abnormal ECG f/u stress echo. Also recommended coronary calcium score  2. Anxiety:  Work related discussed  coping strategies  3. Anemia:   Lab Results  Component Value Date   HCT 37.6 11/21/2015      Current medicines are reviewed at length with the patient today.  The patient does not have concerns regarding medicines.  The following changes have been made:  no change  Labs/ tests ordered today include: stress echo  No orders of the defined types were placed in this encounter.    Disposition:   FU with me in a year     Signed, Jenkins Rouge, MD  12/17/2015 9:28 AM    [address], [place], [place]  [address] Phone: [phone]; Fax: [phone]

## 2015-12-17 ENCOUNTER — Ambulatory Visit (INDEPENDENT_AMBULATORY_CARE_PROVIDER_SITE_OTHER): Payer: BLUE CROSS/BLUE SHIELD | Admitting: Cardiovascular Disease

## 2015-12-17 ENCOUNTER — Ambulatory Visit (INDEPENDENT_AMBULATORY_CARE_PROVIDER_SITE_OTHER)
Admission: RE | Admit: 2015-12-17 | Discharge: 2015-12-17 | Disposition: A | Discharge status: Home / Self Care | Payer: Self-pay | Source: Ambulatory Visit | Attending: Cardiovascular Disease | Admitting: Cardiovascular Disease

## 2015-12-17 ENCOUNTER — Encounter: Payer: Self-pay | Admitting: Cardiovascular Disease

## 2015-12-17 VITALS — BP 147/82 | HR 68 | Ht 62.0 in | Wt 209.1 lb

## 2015-12-17 DIAGNOSIS — R0789 Other chest pain: Secondary | ICD-10-CM

## 2015-12-17 NOTE — Patient Instructions (Addendum)
Medication Instructions:  Your physician recommends that you continue on your current medications as directed. Please refer to the Current Medication list given to you today.  Labwork: NONE  Testing/Procedures: Your physician has requested that you have an exercise tolerance test with echocardiogram.  For further information please visit [email or web address]. Please also follow instruction sheet, as given.  Your physician has requested that you have cardiac CT calcium score. Cardiac computed tomography (CT) is a painless test that uses an x-ray machine to take clear, detailed pictures of your heart. For further information please visit [email or web address]. Please follow instruction sheet as given.  Follow-Up: Your physician wants you to follow-up in: 12 months with Dr. Johnsie Cancel. You will receive a reminder letter in the mail two months in advance. If you don't receive a letter, please call our office to schedule the follow-up appointment.   If you need a refill on your cardiac medications before your next appointment, please call your pharmacy.

## 2015-12-27 ENCOUNTER — Other Ambulatory Visit: Payer: Self-pay

## 2015-12-27 DIAGNOSIS — R0600 Dyspnea, unspecified: Secondary | ICD-10-CM

## 2016-01-07 ENCOUNTER — Other Ambulatory Visit (HOSPITAL_COMMUNITY): Payer: BLUE CROSS/BLUE SHIELD

## 2016-01-08 ENCOUNTER — Other Ambulatory Visit (HOSPITAL_COMMUNITY): Payer: BLUE CROSS/BLUE SHIELD

## 2016-03-10 ENCOUNTER — Telehealth: Payer: Self-pay

## 2016-03-10 NOTE — Telephone Encounter (Signed)
Patient cancelled her echo and gxt because of her insurance not covering test.

## 2016-08-06 DIAGNOSIS — R030 Elevated blood-pressure reading, without diagnosis of hypertension: Secondary | ICD-10-CM | POA: Diagnosis not present

## 2016-08-06 DIAGNOSIS — L309 Dermatitis, unspecified: Secondary | ICD-10-CM | POA: Diagnosis not present

## 2016-08-06 DIAGNOSIS — E6609 Other obesity due to excess calories: Secondary | ICD-10-CM | POA: Diagnosis not present

## 2016-08-06 DIAGNOSIS — F17290 Nicotine dependence, other tobacco product, uncomplicated: Secondary | ICD-10-CM | POA: Diagnosis not present

## 2016-09-14 DIAGNOSIS — Z1231 Encounter for screening mammogram for malignant neoplasm of breast: Secondary | ICD-10-CM | POA: Diagnosis not present

## 2016-09-14 DIAGNOSIS — Z6838 Body mass index (BMI) 38.0-38.9, adult: Secondary | ICD-10-CM | POA: Diagnosis not present

## 2016-09-14 DIAGNOSIS — Z1151 Encounter for screening for human papillomavirus (HPV): Secondary | ICD-10-CM | POA: Diagnosis not present

## 2016-09-14 DIAGNOSIS — Z01419 Encounter for gynecological examination (general) (routine) without abnormal findings: Secondary | ICD-10-CM | POA: Diagnosis not present

## 2016-11-23 DIAGNOSIS — N938 Other specified abnormal uterine and vaginal bleeding: Secondary | ICD-10-CM | POA: Diagnosis not present

## 2016-11-23 DIAGNOSIS — N939 Abnormal uterine and vaginal bleeding, unspecified: Secondary | ICD-10-CM | POA: Diagnosis not present

## 2016-11-23 DIAGNOSIS — D259 Leiomyoma of uterus, unspecified: Secondary | ICD-10-CM | POA: Diagnosis not present

## 2016-11-24 DIAGNOSIS — Z800 Family history of malignant neoplasm of digestive organs: Secondary | ICD-10-CM | POA: Diagnosis not present

## 2016-11-24 DIAGNOSIS — K921 Melena: Secondary | ICD-10-CM | POA: Diagnosis not present

## 2016-12-10 DIAGNOSIS — D259 Leiomyoma of uterus, unspecified: Secondary | ICD-10-CM | POA: Diagnosis not present

## 2016-12-10 DIAGNOSIS — N938 Other specified abnormal uterine and vaginal bleeding: Secondary | ICD-10-CM | POA: Diagnosis not present

## 2016-12-28 DIAGNOSIS — K635 Polyp of colon: Secondary | ICD-10-CM | POA: Diagnosis not present

## 2016-12-28 DIAGNOSIS — Z800 Family history of malignant neoplasm of digestive organs: Secondary | ICD-10-CM | POA: Diagnosis not present

## 2016-12-28 DIAGNOSIS — D126 Benign neoplasm of colon, unspecified: Secondary | ICD-10-CM | POA: Diagnosis not present

## 2016-12-28 DIAGNOSIS — Z1211 Encounter for screening for malignant neoplasm of colon: Secondary | ICD-10-CM | POA: Diagnosis not present

## 2016-12-30 DIAGNOSIS — Z1211 Encounter for screening for malignant neoplasm of colon: Secondary | ICD-10-CM | POA: Diagnosis not present

## 2016-12-30 DIAGNOSIS — D126 Benign neoplasm of colon, unspecified: Secondary | ICD-10-CM | POA: Diagnosis not present

## 2016-12-30 DIAGNOSIS — K635 Polyp of colon: Secondary | ICD-10-CM | POA: Diagnosis not present

## 2017-01-05 DIAGNOSIS — F17290 Nicotine dependence, other tobacco product, uncomplicated: Secondary | ICD-10-CM | POA: Diagnosis not present

## 2017-01-05 DIAGNOSIS — F1129 Opioid dependence with unspecified opioid-induced disorder: Secondary | ICD-10-CM | POA: Diagnosis not present

## 2017-01-05 DIAGNOSIS — N938 Other specified abnormal uterine and vaginal bleeding: Secondary | ICD-10-CM | POA: Diagnosis not present

## 2017-01-05 DIAGNOSIS — F064 Anxiety disorder due to known physiological condition: Secondary | ICD-10-CM | POA: Diagnosis not present

## 2017-01-05 DIAGNOSIS — E6609 Other obesity due to excess calories: Secondary | ICD-10-CM | POA: Diagnosis not present

## 2017-08-12 DIAGNOSIS — L308 Other specified dermatitis: Secondary | ICD-10-CM | POA: Diagnosis not present

## 2017-08-12 DIAGNOSIS — F064 Anxiety disorder due to known physiological condition: Secondary | ICD-10-CM | POA: Diagnosis not present

## 2017-08-12 DIAGNOSIS — E6609 Other obesity due to excess calories: Secondary | ICD-10-CM | POA: Diagnosis not present

## 2017-08-12 DIAGNOSIS — R946 Abnormal results of thyroid function studies: Secondary | ICD-10-CM | POA: Diagnosis not present

## 2017-08-12 DIAGNOSIS — Z0000 Encounter for general adult medical examination without abnormal findings: Secondary | ICD-10-CM | POA: Diagnosis not present

## 2017-08-18 DIAGNOSIS — R51 Headache: Secondary | ICD-10-CM | POA: Diagnosis not present

## 2017-11-10 DIAGNOSIS — E6609 Other obesity due to excess calories: Secondary | ICD-10-CM | POA: Diagnosis not present

## 2017-11-10 DIAGNOSIS — F064 Anxiety disorder due to known physiological condition: Secondary | ICD-10-CM | POA: Diagnosis not present

## 2017-12-01 DIAGNOSIS — Z01419 Encounter for gynecological examination (general) (routine) without abnormal findings: Secondary | ICD-10-CM | POA: Diagnosis not present

## 2017-12-01 DIAGNOSIS — Z1151 Encounter for screening for human papillomavirus (HPV): Secondary | ICD-10-CM | POA: Diagnosis not present

## 2017-12-01 DIAGNOSIS — Z6838 Body mass index (BMI) 38.0-38.9, adult: Secondary | ICD-10-CM | POA: Diagnosis not present

## 2017-12-01 DIAGNOSIS — Z1231 Encounter for screening mammogram for malignant neoplasm of breast: Secondary | ICD-10-CM | POA: Diagnosis not present

## 2018-01-23 IMAGING — CR DG CHEST 2V
2 series · 2 of 2 positions shown · non-contrast
Comparison: None.

CLINICAL DATA: Two day history of left-sided chest pain radiating
into the back and left arm. Current 30 year smoker.

EXAM:
CHEST  2 VIEW

[chest pa]
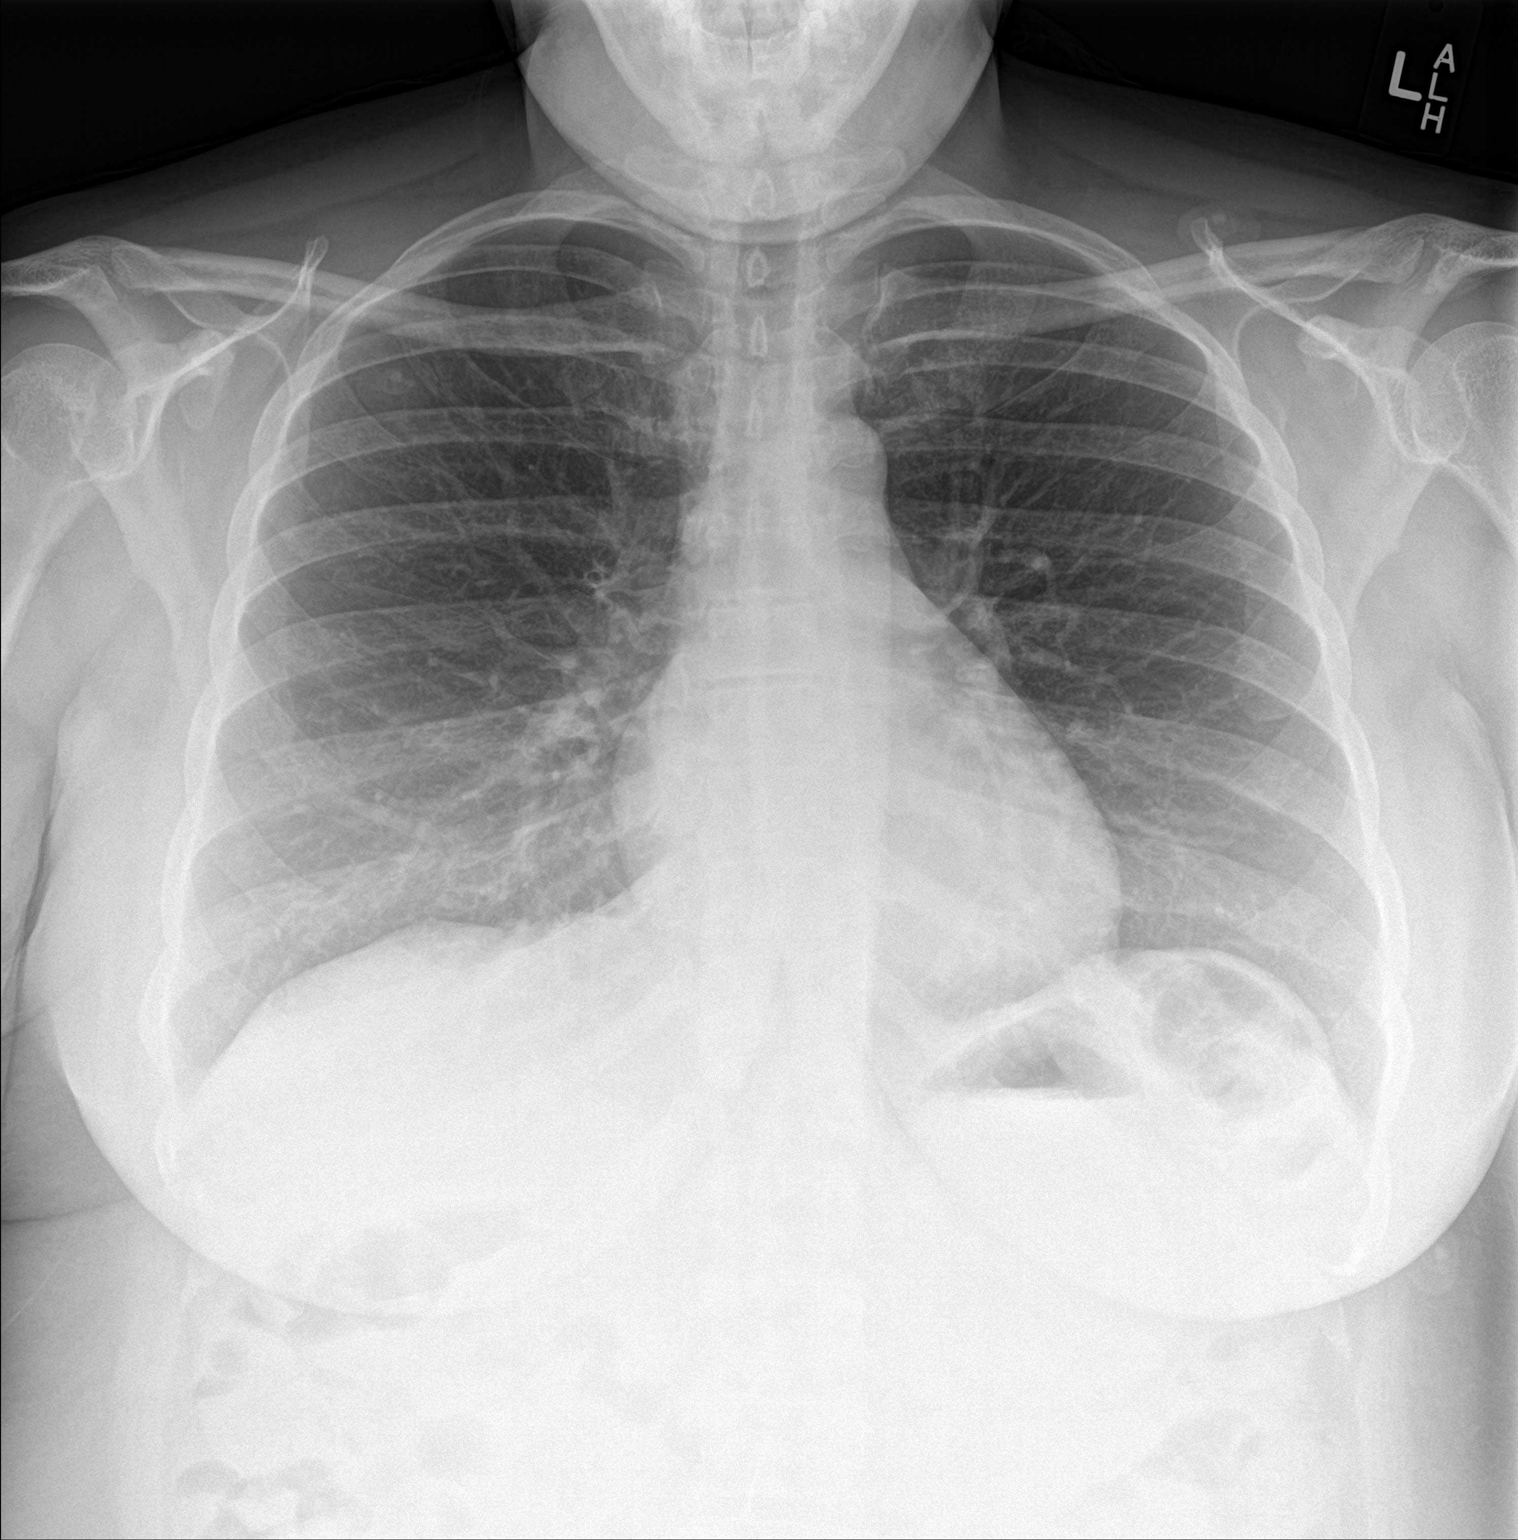

[chest lat]
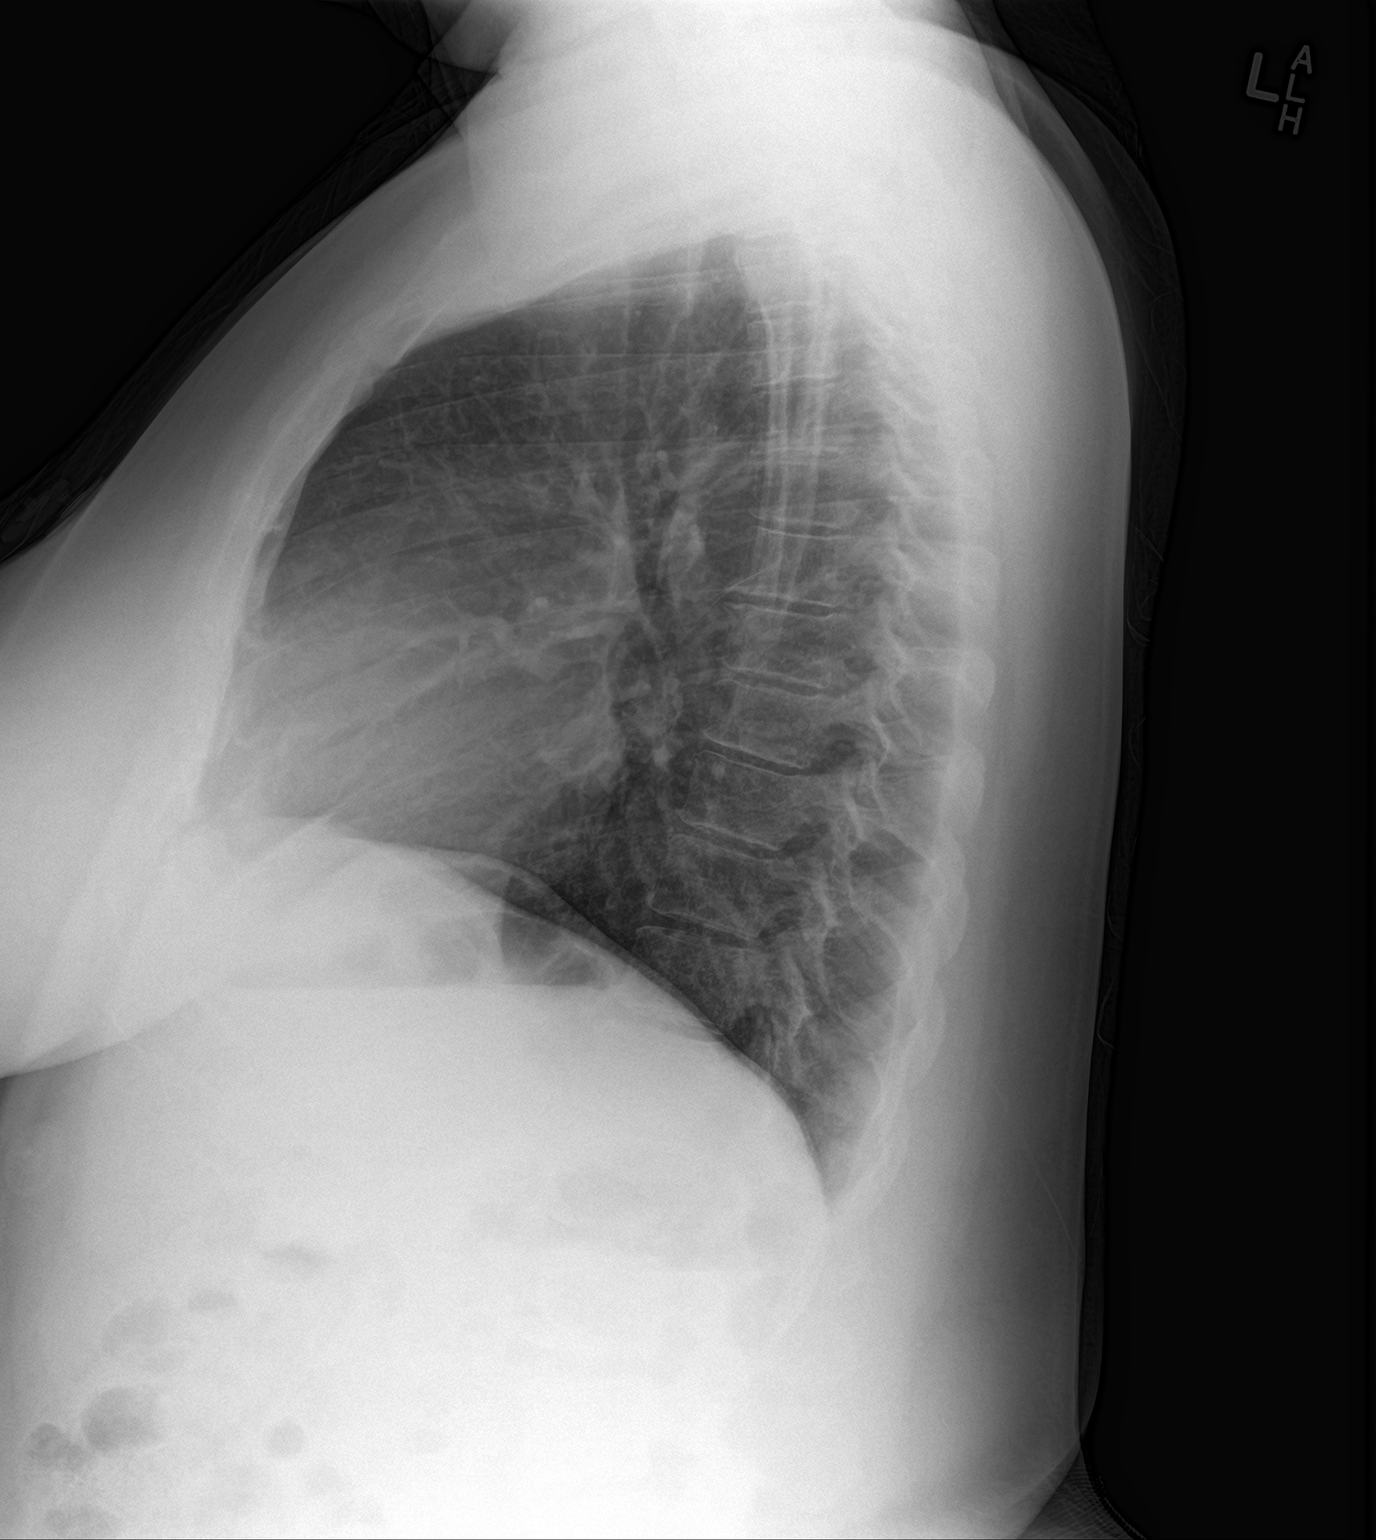

[2 of 2 positions shown; findings below may reference images not displayed]

FINDINGS: Cardiomediastinal silhouette unremarkable. Lungs clear.
Bronchovascular markings normal. Pulmonary vascularity normal. No
visible pleural effusions. No pneumothorax. Mild degenerative
changes involving the thoracic spine.
IMPRESSION: No acute cardiopulmonary disease.

## 2018-03-18 DIAGNOSIS — L03119 Cellulitis of unspecified part of limb: Secondary | ICD-10-CM | POA: Diagnosis not present

## 2018-05-04 ENCOUNTER — Emergency Department (HOSPITAL_COMMUNITY): Payer: BLUE CROSS/BLUE SHIELD

## 2018-05-04 ENCOUNTER — Other Ambulatory Visit: Payer: Self-pay

## 2018-05-04 ENCOUNTER — Emergency Department (HOSPITAL_COMMUNITY)
Admission: EM | Admit: 2018-05-04 | Discharge: 2018-05-04 | Disposition: A | Discharge status: Home / Self Care | Payer: BLUE CROSS/BLUE SHIELD | Attending: Emergency Medicine | Admitting: Emergency Medicine

## 2018-05-04 ENCOUNTER — Encounter (HOSPITAL_COMMUNITY): Payer: Self-pay | Admitting: Emergency Medicine

## 2018-05-04 DIAGNOSIS — R202 Paresthesia of skin: Secondary | ICD-10-CM | POA: Diagnosis not present

## 2018-05-04 DIAGNOSIS — F17210 Nicotine dependence, cigarettes, uncomplicated: Secondary | ICD-10-CM | POA: Diagnosis not present

## 2018-05-04 DIAGNOSIS — Z79899 Other long term (current) drug therapy: Secondary | ICD-10-CM | POA: Diagnosis not present

## 2018-05-04 DIAGNOSIS — R200 Anesthesia of skin: Secondary | ICD-10-CM

## 2018-05-04 LAB — BASIC METABOLIC PANEL
Anion gap: 7 (ref 5–15)
BUN: 10 mg/dL (ref 6–20)
CHLORIDE: 105 mmol/L (ref 98–111)
CO2: 27 mmol/L (ref 22–32)
CREATININE: 0.65 mg/dL (ref 0.44–1.00)
Calcium: 9.4 mg/dL (ref 8.9–10.3)
GFR calc non Af Amer: >60 mL/min (ref >60)
Glucose, Bld: 93 mg/dL (ref 70–99)
Potassium: 4 mmol/L (ref 3.5–5.1)
Sodium: 139 mmol/L (ref 135–145)

## 2018-05-04 LAB — CBC
HEMATOCRIT: 41.1 % (ref 36.0–46.0)
Hemoglobin: 13.1 g/dL (ref 12.0–15.0)
MCH: 29.6 pg (ref 26.0–34.0)
MCHC: 31.9 g/dL (ref 30.0–36.0)
MCV: 92.8 fL (ref 80.0–100.0)
NRBC: 0 % (ref 0.0–0.2)
Platelets: 391 10*3/uL (ref 150–400)
RBC: 4.43 MIL/uL (ref 3.87–5.11)
RDW: 13.3 % (ref 11.5–15.5)
WBC: 5.6 10*3/uL (ref 4.0–10.5)

## 2018-05-04 NOTE — ED Notes (Signed)
Discharge instructions reviewed with patient. Patient left ambulatory.

## 2018-05-04 NOTE — ED Triage Notes (Signed)
Patient reports left sided numbness and weakness onset of yesterday midday. Patient ambulatory in room, alert and orientated x4. Denies any blurry vision or vision changes.

## 2018-05-04 NOTE — ED Provider Notes (Signed)
[facility] Provider Note   CSN: [id number] Arrival date & time: 05/04/18  1102     History   Chief Complaint Chief Complaint  Patient presents with  . Numbness    HPI Diana Davies is a 47 y.o. female.  Diana Davies is a 47 y.o. female with a history of anemia and uterine fibroids, who presents to the emergency department for evaluation of left-sided facial numbness and left arm numbness.  She reports she first noticed symptoms midday yesterday, before that last known normal was yesterday around noon.  She reports her face started to feel tingly and numb and symptoms then proceeded into her arm.  She denies any symptoms in the left leg and no symptoms on the right side of the body.  She denies any associated facial asymmetry or weakness in the left arm or leg.  No visual changes, no changes in speech or difficulty swallowing.  No dizziness or lightheadedness.  No headaches, no nausea or vomiting.  No associated chest pain, shortness of breath or lightheadedness.  Patient does report being very stressed around the holidays, does have history of possible panic attack and wonders if this could be contributing, but was concerned symptoms could be an acute stroke.  No personal or family history of stroke.  Patient is otherwise fairly healthy, she has no history of hypertension, hyperlipidemia or diabetes.     Past Medical History:  Diagnosis Date  . Anemia   . Chest pain   . Fibroids 2005    Patient Active Problem List   Diagnosis Date Noted  . FIBROIDS, UTERUS 10/05/2007  . TOBACCO USE 10/05/2007  . MIGRAINE HEADACHE 10/05/2007    Past Surgical History:  Procedure Laterality Date  . THYROID SURGERY       OB History   No obstetric history on file.      Home Medications    Prior to Admission medications   Medication Sig Start Date End Date Taking? Authorizing Provider  ascorbic acid (VITAMIN C) 1000 MG tablet Take 1,000 mg by  mouth daily.    [provider]  aspirin 81 MG tablet Take 81 mg by mouth daily as needed (per patient).    [provider]  COD LIVER OIL PO Take 1 capsule by mouth daily as needed (per patient).    [provider]  Cyanocobalamin (VITAMIN B-12 PO) Take 1 tablet by mouth daily as needed (per patient).    [provider]  IRON PO Take 1 capsule by mouth daily as needed (per patient).    [provider]  MAGNESIUM PO Take 1 tablet by mouth daily as needed (per patient).    [provider]  Multiple Vitamin (MULTIVITAMIN WITH MINERALS) TABS tablet Take 1 tablet by mouth daily.    [provider]  Multiple Vitamins-Minerals (ECHINACEA ACZ PO) Take 1 tablet by mouth daily as needed (per patient).    [provider]  TURMERIC PO Take 1 capsule by mouth daily as needed (per patient).    [provider]    Family History Family History  Problem Relation Age of Onset  . Heart attack Mother 40  . Cancer Father        COLON CANCER  . Hypertension Sister     Social History Social History   Tobacco Use  . Smoking status: Current Every Day Smoker    Packs/day: 0.50    Types: Cigarettes    Start date: 12/07/1982  Substance  Use Topics  . Alcohol use: Yes    Comment: 4 x's/wk  . Drug use: No     Allergies   Patient has no known allergies.   Review of Systems Review of Systems  Constitutional: Negative for chills and fever.  HENT: Negative for congestion, rhinorrhea and sore throat.   Eyes: Negative for pain and visual disturbance.  Respiratory: Negative for cough and shortness of breath.   Cardiovascular: Negative for chest pain and leg swelling.  Gastrointestinal: Negative for abdominal pain, nausea and vomiting.  Genitourinary: Negative for dysuria.  Musculoskeletal: Negative for arthralgias, back pain, myalgias and neck pain.  Skin: Negative for color change and rash.  Neurological: Positive for  numbness. Negative for dizziness, seizures, syncope, facial asymmetry, speech difficulty, weakness, light-headedness and headaches.     Physical Exam Updated Vital Signs BP 121/87   Pulse 61   Temp 98 F (36.7 C)   Resp 14   Ht 5\' 2"  (1.575 m)   Wt 95.3 kg   LMP 04/10/2018   SpO2 100%   BMI 38.41 kg/m   Physical Exam Vitals signs and nursing note reviewed.  Constitutional:      General: She is not in acute distress.    Appearance: Normal appearance. She is well-developed. She is not ill-appearing or diaphoretic.  HENT:     Head: Normocephalic and atraumatic.     Mouth/Throat:     Mouth: Mucous membranes are moist.     Pharynx: Oropharynx is clear.  Eyes:     General:        Right eye: No discharge.        Left eye: No discharge.     Extraocular Movements: Extraocular movements intact.     Conjunctiva/sclera: Conjunctivae normal.     Pupils: Pupils are equal, round, and reactive to light.  Neck:     Musculoskeletal: Neck supple.  Cardiovascular:     Rate and Rhythm: Normal rate and regular rhythm.     Pulses: Normal pulses.     Heart sounds: Normal heart sounds.  Pulmonary:     Effort: Pulmonary effort is normal. No respiratory distress.     Breath sounds: Normal breath sounds. No wheezing or rales.     Comments: Respirations equal and unlabored, patient able to speak in full sentences, lungs clear to auscultation bilaterally Abdominal:     General: Abdomen is flat. Bowel sounds are normal. There is no distension.     Palpations: Abdomen is soft. There is no mass.     Tenderness: There is no abdominal tenderness. There is no guarding.     Comments: Abdomen soft, nondistended, nontender to palpation in all quadrants without guarding or peritoneal signs  Musculoskeletal:        General: No deformity.     Right lower leg: No edema.     Left lower leg: No edema.  Skin:    General: Skin is warm and dry.     Capillary Refill: Capillary refill takes less than 2  seconds.  Neurological:     Mental Status: She is alert and oriented to person, place, and time. Mental status is at baseline.     Coordination: Coordination normal.     Comments: Neurological Exam:  Mental Status: Alert and oriented to person, place, and time. Attention and concentration normal. Speech clear. Recent memory is intact. Follows commands. Cranial Nerves: Visual fields grossly intact. EOMI and PERRLA. No nystagmus noted. Facial sensation intact at forehead, maxillary cheek, and chin/mandible bilaterally.  No facial asymmetry or weakness. Hearing grossly normal. Uvula is midline, and palate elevates symmetrically. Normal SCM and trapezius strength. Tongue midline without fasciculations. Motor: Muscle strength 5/5 in proximal and distal UE and LE bilaterally. No pronator drift. Muscle tone normal. Reflexes: 2+ and symmetrical in all four extremities.  Sensation: Intact to light touch in upper and lower extremities distally bilaterally.  Gait: Normal without ataxia. Coordination: Normal FTN bilaterally.  Psychiatric:        Mood and Affect: Mood normal.        Behavior: Behavior normal.      ED Treatments / Results  Labs (all labs ordered are listed, but only abnormal results are displayed) Labs Reviewed  BASIC METABOLIC PANEL  CBC    EKG EKG Interpretation  Date/Time:  Wednesday May 04 2018 11:11:17 EST Ventricular Rate:  67 PR Interval:    QRS Duration: 96 QT Interval:  411 QTC Calculation: 434 R Axis:   24 Text Interpretation:  Sinus rhythm Borderline short PR interval Confirmed by Lennice Sites [phone]) on 05/04/2018 11:13:29 AM   Radiology Ct Head Wo Contrast  Result Date: 05/04/2018 CLINICAL DATA:  Right-sided facial numbness since yesterday. EXAM: CT HEAD WITHOUT CONTRAST TECHNIQUE: Contiguous axial images were obtained from the base of the skull through the vertex without intravenous contrast. COMPARISON:  None. FINDINGS: Brain: The brain shows a  normal appearance without evidence of malformation, atrophy, old or acute small or large vessel infarction, mass lesion, hemorrhage, hydrocephalus or extra-axial collection. Vascular: No hyperdense vessel. No evidence of atherosclerotic calcification. Skull: Normal.  No traumatic finding.  No focal bone lesion. Sinuses/Orbits: Sinuses are clear. Orbits appear normal. Mastoids are clear. Other: None significant IMPRESSION: Normal head CT Electronically Signed   By: Nelson Chimes M.D.   On: 05/04/2018 12:18    Procedures Procedures (including critical care time)  Medications Ordered in ED Medications - No data to display   Initial Impression / Assessment and Plan / ED Course  I have reviewed the triage vital signs and the nursing notes.  Pertinent labs & imaging results that were available during my care of the patient were reviewed by me and considered in my medical decision making (see chart for details).  Presents for evaluation of left-sided facial numbness and left arm numbness, symptoms started around noon yesterday, no associated weakness, facial asymmetry, vision change or speech change.  No headaches, nausea or vomiting.  No dizziness, no chest pain or shortness of breath.  No history of prior symptoms and patient has no history of stroke, no significant risk factors for stroke no family history and patient has no history of hypertension, diabetes or hyperlipidemia.  Patient has normal sensation on exam and no other focal neurologic deficits noted, and I am overall very reassured.  We will get basic labs, EKG and CT of the head if patient had acute infarct to affect sensation over the left side of her body would expect to see some evidence on CT.  Patient does admit to feeling anxious and stressed I do think this could be contributing.  Labs without any acute abnormalities and EKG shows sinus rhythm with no concerning changes.  CT of the head shows no evidence of acute infarct or other acute  changes.  Patient remains neurologically intact.  Provided patient with reassurance, presentation not concerning for acute stroke.  At this time feel patient is stable for discharge home with close follow-up with her PCP.  Return precautions provided.  Patient expresses understanding and  is in agreement with plan.  Stable for discharge home.  Case discussed with Dr. Ronnald Nian who agrees with plan.  Final Clinical Impressions(s) / ED Diagnoses   Final diagnoses:  Numbness  Paresthesia    ED Discharge Orders    None       Jacqlyn Larsen, PA-C 05/04/18 1627    Lennice Sites, DO 05/05/18 [phone]

## 2018-05-04 NOTE — Discharge Instructions (Signed)
Your work-up today is very reassuring.  Could be related to anxiety, but I would like you to follow-up with your primary care doctor regarding the symptoms.  Return to the emergency department if you have return of numbness, any weakness, change in speech, difficulty swallowing, severe headache, vision changes, dizziness or any other new or concerning symptoms.

## 2019-03-04 DIAGNOSIS — F4322 Adjustment disorder with anxiety: Secondary | ICD-10-CM | POA: Diagnosis not present

## 2019-03-04 DIAGNOSIS — Z20828 Contact with and (suspected) exposure to other viral communicable diseases: Secondary | ICD-10-CM | POA: Diagnosis not present

## 2019-03-11 DIAGNOSIS — F4322 Adjustment disorder with anxiety: Secondary | ICD-10-CM | POA: Diagnosis not present

## 2019-03-18 DIAGNOSIS — F4322 Adjustment disorder with anxiety: Secondary | ICD-10-CM | POA: Diagnosis not present

## 2019-03-25 DIAGNOSIS — F4322 Adjustment disorder with anxiety: Secondary | ICD-10-CM | POA: Diagnosis not present

## 2019-04-08 DIAGNOSIS — F4322 Adjustment disorder with anxiety: Secondary | ICD-10-CM | POA: Diagnosis not present

## 2019-04-22 DIAGNOSIS — F4322 Adjustment disorder with anxiety: Secondary | ICD-10-CM | POA: Diagnosis not present

## 2019-04-29 DIAGNOSIS — F4322 Adjustment disorder with anxiety: Secondary | ICD-10-CM | POA: Diagnosis not present

## 2019-05-24 DIAGNOSIS — F4322 Adjustment disorder with anxiety: Secondary | ICD-10-CM | POA: Diagnosis not present

## 2019-06-10 DIAGNOSIS — F4322 Adjustment disorder with anxiety: Secondary | ICD-10-CM | POA: Diagnosis not present

## 2019-06-17 DIAGNOSIS — F4322 Adjustment disorder with anxiety: Secondary | ICD-10-CM | POA: Diagnosis not present

## 2019-06-24 DIAGNOSIS — F4322 Adjustment disorder with anxiety: Secondary | ICD-10-CM | POA: Diagnosis not present

## 2019-07-11 DIAGNOSIS — Z01419 Encounter for gynecological examination (general) (routine) without abnormal findings: Secondary | ICD-10-CM | POA: Diagnosis not present

## 2019-07-11 DIAGNOSIS — Z1151 Encounter for screening for human papillomavirus (HPV): Secondary | ICD-10-CM | POA: Diagnosis not present

## 2019-07-11 DIAGNOSIS — Z6838 Body mass index (BMI) 38.0-38.9, adult: Secondary | ICD-10-CM | POA: Diagnosis not present

## 2019-07-20 DIAGNOSIS — N951 Menopausal and female climacteric states: Secondary | ICD-10-CM | POA: Diagnosis not present

## 2019-07-25 DIAGNOSIS — R928 Other abnormal and inconclusive findings on diagnostic imaging of breast: Secondary | ICD-10-CM | POA: Diagnosis not present

## 2019-07-25 DIAGNOSIS — N6002 Solitary cyst of left breast: Secondary | ICD-10-CM | POA: Diagnosis not present

## 2019-07-27 ENCOUNTER — Ambulatory Visit: Payer: Self-pay | Attending: Family

## 2019-07-27 DIAGNOSIS — Z23 Encounter for immunization: Secondary | ICD-10-CM

## 2019-07-27 NOTE — Progress Notes (Signed)
   Covid-19 Vaccination Clinic  Name:  Diana Davies    MRN: [id number] DOB: 05-16-1970  07/27/2019  [name] was observed post Covid-19 immunization for 15 minutes without incident. She was provided with Vaccine Information Sheet and instruction to access the V-Safe system.   [name] was instructed to call 911 with any severe reactions post vaccine: Marland Kitchen Difficulty breathing  . Swelling of face and throat  . A fast heartbeat  . A bad rash all over body  . Dizziness and weakness   Immunizations Administered    Name Date Dose VIS Date Route   Moderna COVID-19 Vaccine 07/27/2019 11:06 AM 0.5 mL 04/11/2019 Intramuscular   Manufacturer: Moderna   Lot: [id number]   [id number]

## 2019-08-29 ENCOUNTER — Ambulatory Visit: Payer: Self-pay | Attending: Family

## 2019-08-29 DIAGNOSIS — Z23 Encounter for immunization: Secondary | ICD-10-CM

## 2019-08-29 NOTE — Progress Notes (Signed)
   Covid-19 Vaccination Clinic  Name:  Diana Davies    MRN: [id number] DOB: 11-17-1970  08/29/2019  Ms. [name] was observed post Covid-19 immunization for 15 minutes without incident. She was provided with Vaccine Information Sheet and instruction to access the V-Safe system.   Ms. [name] was instructed to call 911 with any severe reactions post vaccine: Marland Kitchen Difficulty breathing  . Swelling of face and throat  . A fast heartbeat  . A bad rash all over body  . Dizziness and weakness   Immunizations Administered    Name Date Dose VIS Date Route   Moderna COVID-19 Vaccine 08/29/2019 10:53 AM 0.5 mL 04/2019 Intramuscular   Manufacturer: Moderna   Lot: [id number]   [place]:[id number]

## 2019-09-05 DIAGNOSIS — F17290 Nicotine dependence, other tobacco product, uncomplicated: Secondary | ICD-10-CM | POA: Diagnosis not present

## 2019-09-05 DIAGNOSIS — F064 Anxiety disorder due to known physiological condition: Secondary | ICD-10-CM | POA: Diagnosis not present

## 2019-09-12 DIAGNOSIS — Z0001 Encounter for general adult medical examination with abnormal findings: Secondary | ICD-10-CM | POA: Diagnosis not present

## 2019-09-12 DIAGNOSIS — E6609 Other obesity due to excess calories: Secondary | ICD-10-CM | POA: Diagnosis not present

## 2019-09-12 DIAGNOSIS — N938 Other specified abnormal uterine and vaginal bleeding: Secondary | ICD-10-CM | POA: Diagnosis not present

## 2019-09-12 DIAGNOSIS — F064 Anxiety disorder due to known physiological condition: Secondary | ICD-10-CM | POA: Diagnosis not present

## 2019-10-03 ENCOUNTER — Encounter (HOSPITAL_COMMUNITY): Payer: Self-pay | Admitting: Emergency Medicine

## 2019-10-03 ENCOUNTER — Other Ambulatory Visit: Payer: Self-pay

## 2019-10-03 ENCOUNTER — Emergency Department (HOSPITAL_COMMUNITY)
Admission: EM | Admit: 2019-10-03 | Discharge: 2019-10-03 | Disposition: A | Discharge status: Home / Self Care | Payer: BC Managed Care – PPO | Attending: Emergency Medicine | Admitting: Emergency Medicine

## 2019-10-03 DIAGNOSIS — W25XXXA Contact with sharp glass, initial encounter: Secondary | ICD-10-CM | POA: Insufficient documentation

## 2019-10-03 DIAGNOSIS — Y9389 Activity, other specified: Secondary | ICD-10-CM | POA: Insufficient documentation

## 2019-10-03 DIAGNOSIS — Z5321 Procedure and treatment not carried out due to patient leaving prior to being seen by health care provider: Secondary | ICD-10-CM | POA: Diagnosis not present

## 2019-10-03 DIAGNOSIS — Y929 Unspecified place or not applicable: Secondary | ICD-10-CM | POA: Diagnosis not present

## 2019-10-03 DIAGNOSIS — Y999 Unspecified external cause status: Secondary | ICD-10-CM | POA: Diagnosis not present

## 2019-10-03 DIAGNOSIS — T189XXA Foreign body of alimentary tract, part unspecified, initial encounter: Secondary | ICD-10-CM | POA: Diagnosis not present

## 2019-10-03 NOTE — ED Triage Notes (Signed)
Pt. Stated, yesterday at lunch I bit into a large piece of glass and I probably ingested a piece of glass.

## 2019-10-06 DIAGNOSIS — R079 Chest pain, unspecified: Secondary | ICD-10-CM | POA: Diagnosis not present

## 2020-07-06 IMAGING — CT CT HEAD W/O CM
4 series · 17 of 47 positions shown, 19 images · non-contrast
Comparison: None.

CLINICAL DATA: Right-sided facial numbness since yesterday.

EXAM:
CT HEAD WITHOUT CONTRAST
TECHNIQUE: Contiguous axial images were obtained from the base of the skull
through the vertex without intravenous contrast.

[Series 3: head without · axial · non-contrast · 0.44mm/px · z∈[-86,+39]mm · 7 of 35 slices shown, 9 images]
[im 5/35  brain]
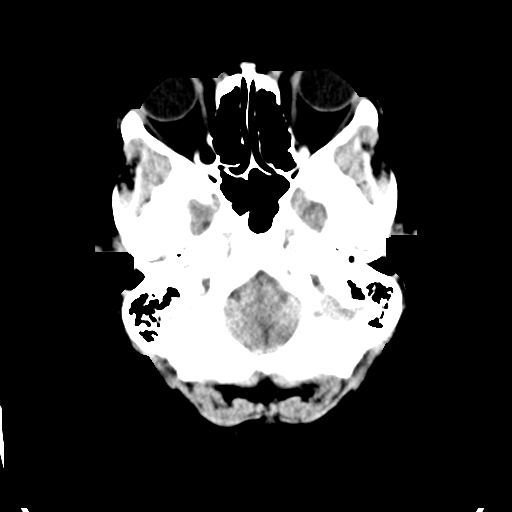
[im 5/35  bone]
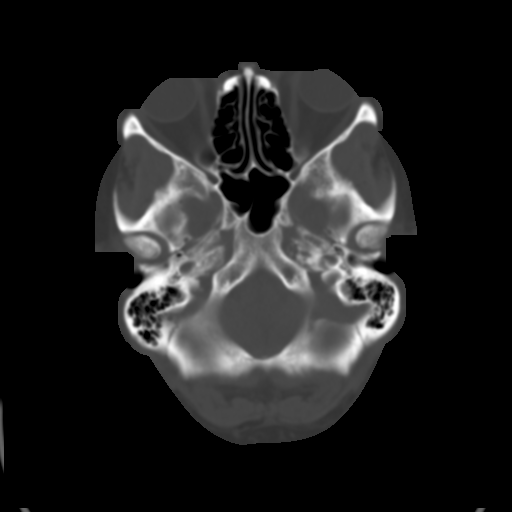
[im 9/35  brain]
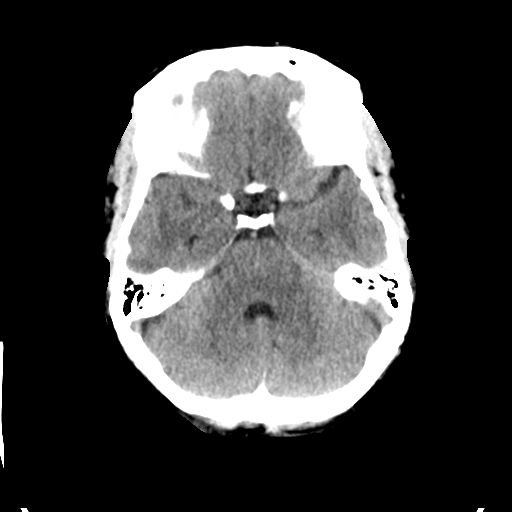
[im 13/35  brain]
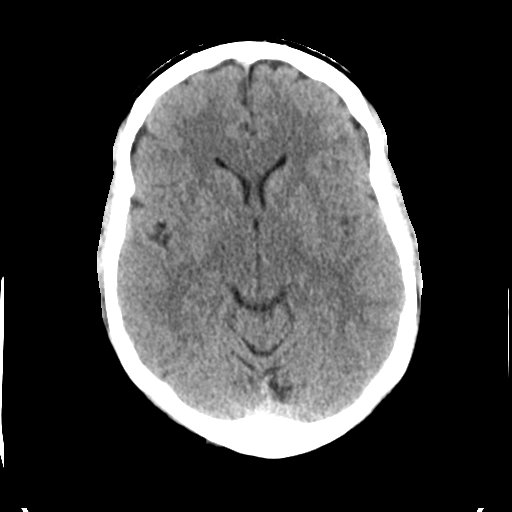
[im 18/35  brain]
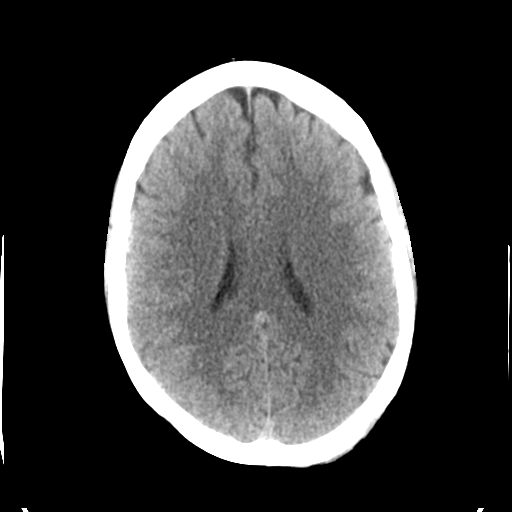
[im 22/35  brain]
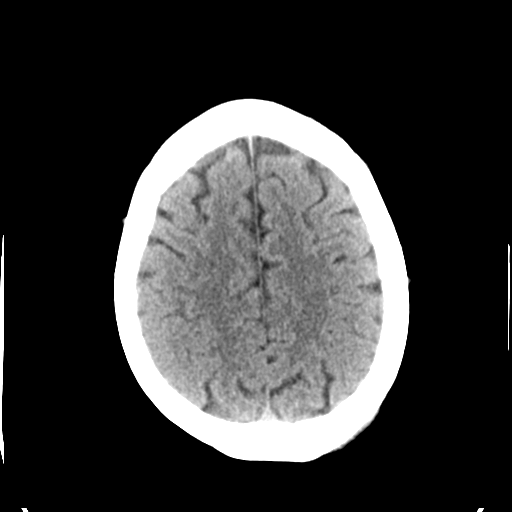
[im 22/35  bone]
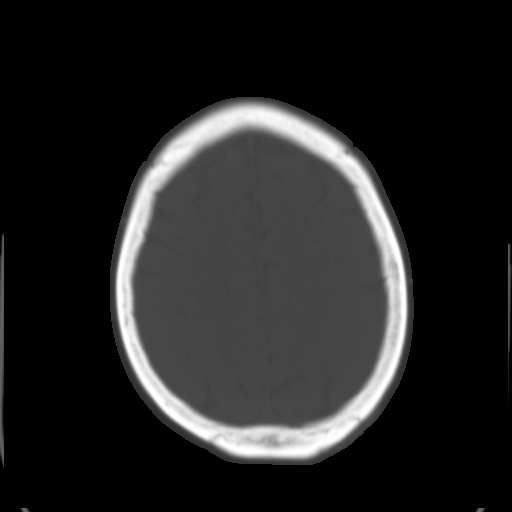
[im 26/35  brain]
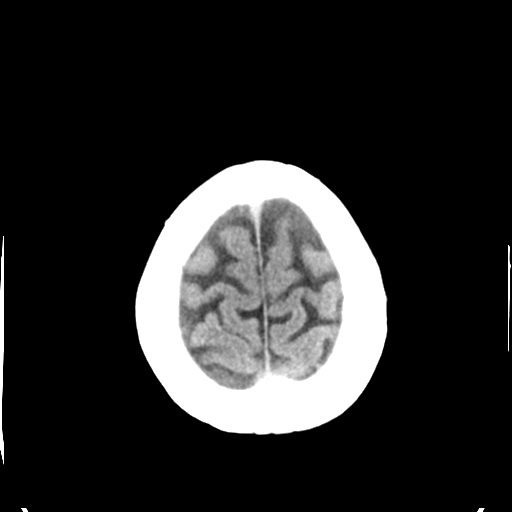
[im 30/35  brain]
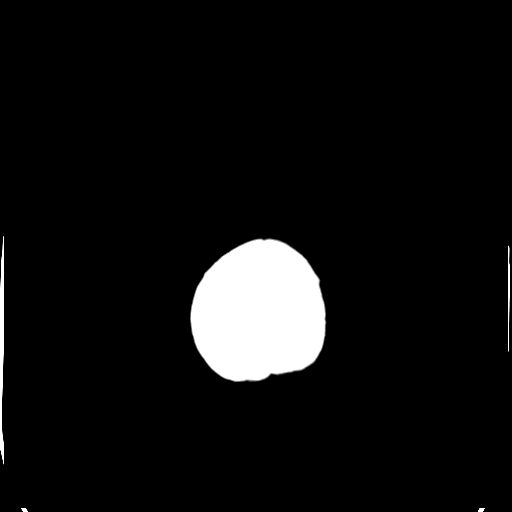

[Series 4: head bone · axial · 0.44mm/px · z∈[-90,-30]mm · 4 of 87 slices shown]
[im 9/87  bone]
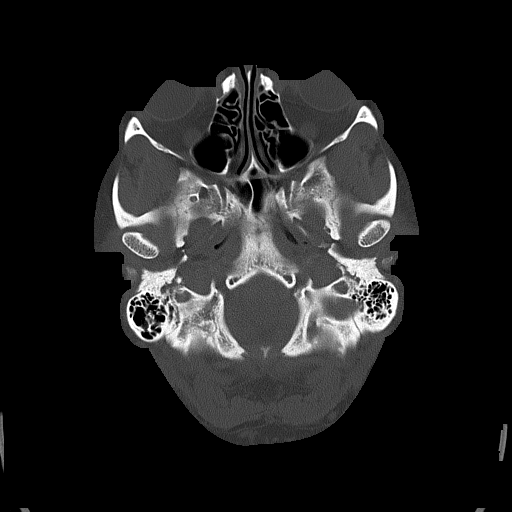
[im 18/87  bone]
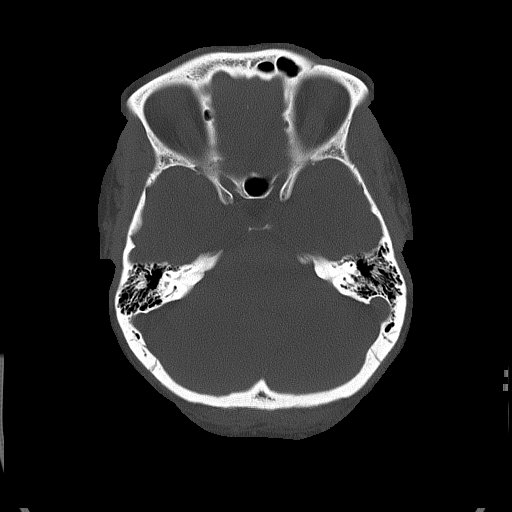
[im 26/87  bone]
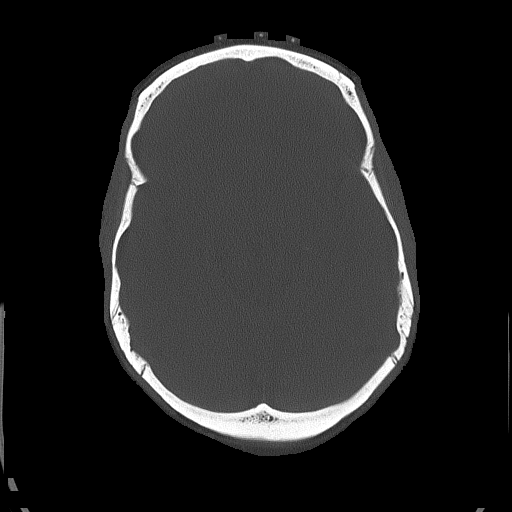
[im 39/87  bone]
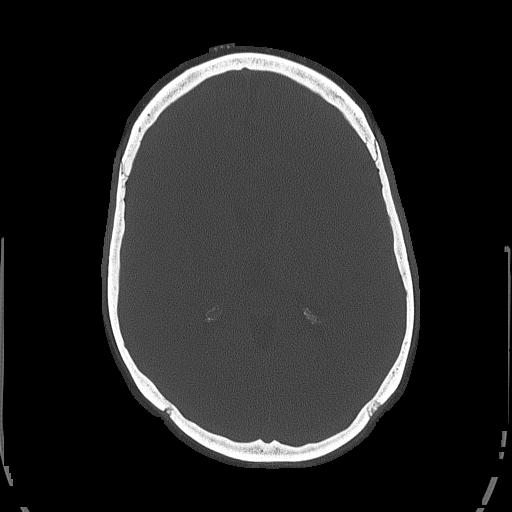

[Series 5: head without cor · coronal · non-contrast · 0.33mm/px · 3 of 67 slices shown]
[im 23/67  brain]
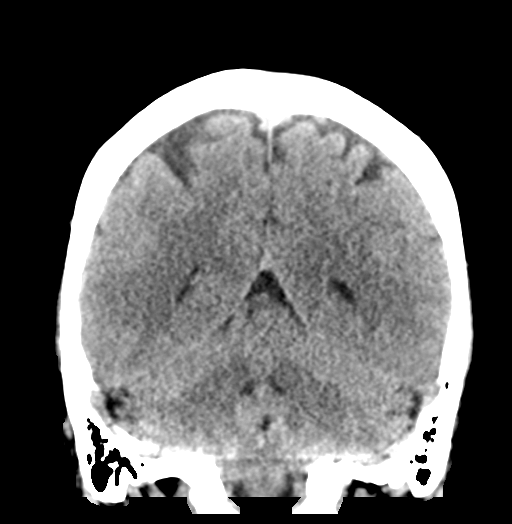
[im 30/67  brain]
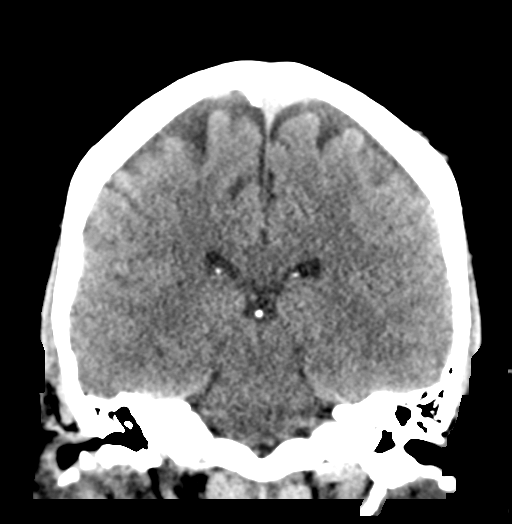
[im 37/67  brain]
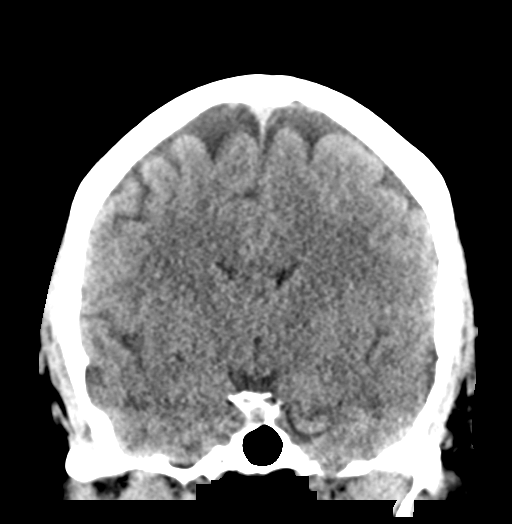

[Series 6: head without sag · sagittal · non-contrast · 0.34mm/px · 3 of 67 slices shown]
[im 23/67  brain]
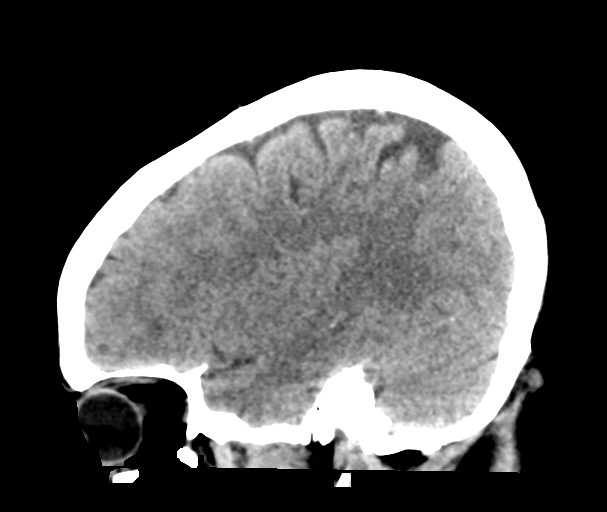
[im 34/67  brain]
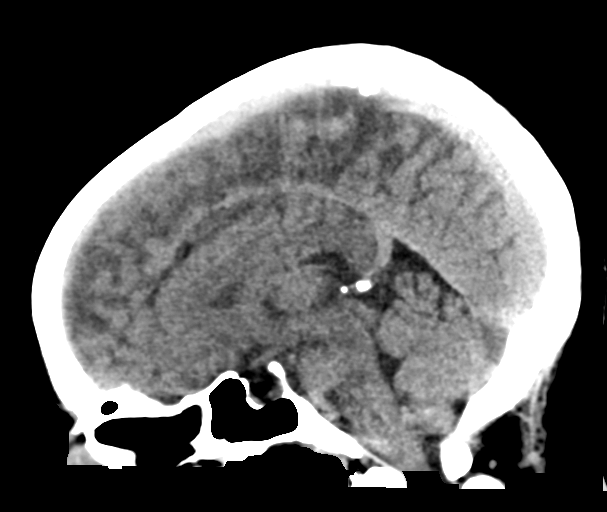
[im 45/67  brain]
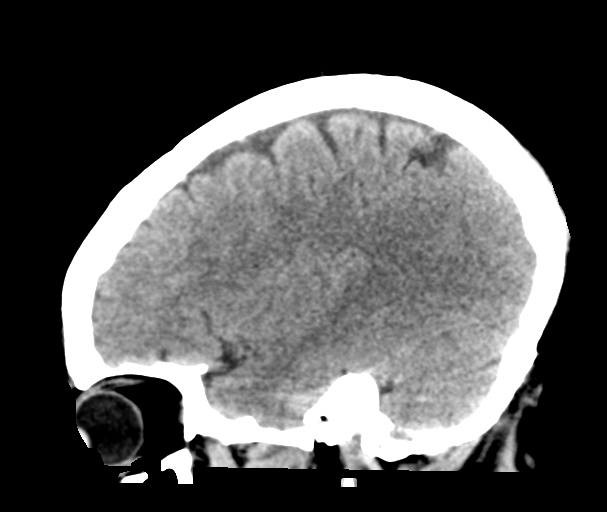

[17 of 47 positions shown; findings below may reference images not displayed]

FINDINGS: Brain: The brain shows a normal appearance without evidence of
malformation, atrophy, old or acute small or large vessel
infarction, mass lesion, hemorrhage, hydrocephalus or extra-axial
collection.

Vascular: No hyperdense vessel. No evidence of atherosclerotic
calcification.

Skull: Normal.  No traumatic finding.  No focal bone lesion.

Sinuses/Orbits: Sinuses are clear. Orbits appear normal. Mastoids
are clear.

Other: None significant
IMPRESSION: Normal head CT

## 2020-09-06 DIAGNOSIS — Z0001 Encounter for general adult medical examination with abnormal findings: Secondary | ICD-10-CM | POA: Diagnosis not present

## 2020-09-06 DIAGNOSIS — F064 Anxiety disorder due to known physiological condition: Secondary | ICD-10-CM | POA: Diagnosis not present

## 2020-09-12 DIAGNOSIS — Z0001 Encounter for general adult medical examination with abnormal findings: Secondary | ICD-10-CM | POA: Diagnosis not present

## 2021-01-17 DIAGNOSIS — S39012A Strain of muscle, fascia and tendon of lower back, initial encounter: Secondary | ICD-10-CM | POA: Diagnosis not present

## 2021-01-17 DIAGNOSIS — F064 Anxiety disorder due to known physiological condition: Secondary | ICD-10-CM | POA: Diagnosis not present

## 2021-01-17 DIAGNOSIS — E6609 Other obesity due to excess calories: Secondary | ICD-10-CM | POA: Diagnosis not present

## 2021-02-12 DIAGNOSIS — Z6839 Body mass index (BMI) 39.0-39.9, adult: Secondary | ICD-10-CM | POA: Diagnosis not present

## 2021-02-12 DIAGNOSIS — Z01419 Encounter for gynecological examination (general) (routine) without abnormal findings: Secondary | ICD-10-CM | POA: Diagnosis not present

## 2021-02-12 DIAGNOSIS — Z1231 Encounter for screening mammogram for malignant neoplasm of breast: Secondary | ICD-10-CM | POA: Diagnosis not present

## 2023-01-06 ENCOUNTER — Ambulatory Visit
Admission: RE | Admit: 2023-01-06 | Discharge: 2023-01-06 | Disposition: A | Discharge status: Home / Self Care | Payer: No Typology Code available for payment source | Source: Ambulatory Visit | Attending: Family Medicine | Admitting: Family Medicine

## 2023-01-06 ENCOUNTER — Other Ambulatory Visit: Payer: Self-pay | Admitting: Family Medicine

## 2023-01-06 DIAGNOSIS — M7731 Calcaneal spur, right foot: Secondary | ICD-10-CM

## 2023-01-26 ENCOUNTER — Ambulatory Visit (INDEPENDENT_AMBULATORY_CARE_PROVIDER_SITE_OTHER): Payer: No Typology Code available for payment source | Admitting: Podiatry

## 2023-01-26 DIAGNOSIS — Z91199 Patient's noncompliance with other medical treatment and regimen due to unspecified reason: Secondary | ICD-10-CM

## 2023-01-26 NOTE — Progress Notes (Unsigned)
No show

## 2023-02-08 ENCOUNTER — Ambulatory Visit: Payer: No Typology Code available for payment source | Admitting: Podiatry

## 2023-02-08 ENCOUNTER — Encounter: Payer: Self-pay | Admitting: Podiatry

## 2023-02-08 DIAGNOSIS — M722 Plantar fascial fibromatosis: Secondary | ICD-10-CM

## 2023-02-08 NOTE — Patient Instructions (Signed)

## 2023-02-08 NOTE — Progress Notes (Signed)
  Subjective:  Patient ID: Diana Davies, female    DOB: 01/05/1971,   MRN: [id number]  Chief Complaint  Patient presents with   Foot Pain    Pt presents today for pain in the right heel    52 y.o. female presents for concern of right heel pain that has been ongoing for about 6-8 months. Relates first steps after rest are most painful. Relates pain in the bottom of her heel. Has been taking meloxicam from PCP that has been very helpful. Has also tried some different shoes and inserts with mild relief.  . Denies any other pedal complaints. Denies n/v/f/c.   Past Medical History:  Diagnosis Date   Anemia    Chest pain    Fibroids 2005    Objective:  Physical Exam: Vascular: DP/PT pulses 2/4 bilateral. CFT <3 seconds. Normal hair growth on digits. No edema.  Skin. No lacerations or abrasions bilateral feet.  Musculoskeletal: MMT 5/5 bilateral lower extremities in DF, PF, Inversion and Eversion. Deceased ROM in DF of ankle joint. Tender to the medial calcaneal tubercle right . No pain with achilles, PT or arch. No pain with calcaneal squeeze.  Neurological: Sensation intact to light touch.   Assessment:   1. Plantar fasciitis, right      Plan:  Patient was evaluated and treated and all questions answered. Discussed plantar fasciitis with patient.  X-rays reviewed and discussed with patient. No acute fractures or dislocations noted. Mild spurring noted at inferior calcaneus.  Discussed treatment options including, ice, NSAIDS, supportive shoes, bracing, and stretching. Stretching exercises provided to be done on a daily basis.   Continue meloxicam  PF brace dispensed.  Follow-up 6 weeks or sooner if any problems arise. In the meantime, encouraged to call the office with any questions, concerns, change in symptoms.     Louann Sjogren, DPM

## 2023-02-09 ENCOUNTER — Encounter: Payer: Self-pay | Admitting: Podiatry

## 2023-03-22 ENCOUNTER — Ambulatory Visit: Payer: No Typology Code available for payment source | Admitting: Podiatry

## 2023-03-22 ENCOUNTER — Encounter: Payer: Self-pay | Admitting: Podiatry

## 2023-03-22 DIAGNOSIS — M722 Plantar fascial fibromatosis: Secondary | ICD-10-CM | POA: Diagnosis not present

## 2023-03-22 DIAGNOSIS — Z13228 Encounter for screening for other metabolic disorders: Secondary | ICD-10-CM | POA: Insufficient documentation

## 2023-03-22 NOTE — Progress Notes (Signed)
  Subjective:  Patient ID: Diana Davies, female    DOB: 1970-07-19,   MRN: [id number]  Chief Complaint  Patient presents with   Foot Pain    Follow up PF right   "Its better. I got new shoes, doing exercises and wearing the brace still, so all that has helped"    52 y.o. female presents for follow-up of right foot plantar fasciitis. Relates she has been in new shoes and wearing the brace and stretching. Relates she is doing a lot better about 50%. Relates she does get some pain occasionally and stretches and that helps.  . Denies any other pedal complaints. Denies n/v/f/c.   Past Medical History:  Diagnosis Date   Anemia    Chest pain    Fibroids 2005    Objective:  Physical Exam: Vascular: DP/PT pulses 2/4 bilateral. CFT <3 seconds. Normal hair growth on digits. No edema.  Skin. No lacerations or abrasions bilateral feet.  Musculoskeletal: MMT 5/5 bilateral lower extremities in DF, PF, Inversion and Eversion. Deceased ROM in DF of ankle joint. Minimally tender to the medial calcaneal tubercle right . No pain with achilles, PT or arch. No pain with calcaneal squeeze.  Neurological: Sensation intact to light touch.   Assessment:   1. Plantar fasciitis, right       Plan:  Patient was evaluated and treated and all questions answered. Discussed plantar fasciitis with patient.  X-rays reviewed and discussed with patient. No acute fractures or dislocations noted. Mild spurring noted at inferior calcaneus.  Discussed treatment options including, ice, NSAIDS, supportive shoes, bracing, and stretching. Continue brace and stretching.  Follow-up as needed    Louann Sjogren, DPM
